# Patient Record
Sex: Male | Born: 1960 | Race: Asian | Hispanic: No | Marital: Married | Smoking: Former smoker
Health system: Southern US, Community
[De-identification: ages and names within clinical notes are randomized; demographics above are authoritative.]

## PROBLEM LIST (undated history)

## (undated) DIAGNOSIS — C801 Malignant (primary) neoplasm, unspecified: Secondary | ICD-10-CM

## (undated) DIAGNOSIS — G44209 Tension-type headache, unspecified, not intractable: Secondary | ICD-10-CM

## (undated) DIAGNOSIS — J309 Allergic rhinitis, unspecified: Secondary | ICD-10-CM

## (undated) DIAGNOSIS — K449 Diaphragmatic hernia without obstruction or gangrene: Secondary | ICD-10-CM

## (undated) DIAGNOSIS — K649 Unspecified hemorrhoids: Secondary | ICD-10-CM

## (undated) DIAGNOSIS — Z85850 Personal history of malignant neoplasm of thyroid: Secondary | ICD-10-CM

## (undated) DIAGNOSIS — D1803 Hemangioma of intra-abdominal structures: Secondary | ICD-10-CM

## (undated) DIAGNOSIS — E785 Hyperlipidemia, unspecified: Secondary | ICD-10-CM

## (undated) DIAGNOSIS — E039 Hypothyroidism, unspecified: Secondary | ICD-10-CM

## (undated) DIAGNOSIS — R079 Chest pain, unspecified: Secondary | ICD-10-CM

## (undated) DIAGNOSIS — K219 Gastro-esophageal reflux disease without esophagitis: Secondary | ICD-10-CM

## (undated) DIAGNOSIS — R040 Epistaxis: Secondary | ICD-10-CM

## (undated) HISTORY — DX: Allergic rhinitis, unspecified: J30.9

## (undated) HISTORY — PX: THYROIDECTOMY: SHX17

## (undated) HISTORY — DX: Hyperlipidemia, unspecified: E78.5

## (undated) HISTORY — DX: Epistaxis: R04.0

## (undated) HISTORY — DX: Chest pain, unspecified: R07.9

## (undated) HISTORY — DX: Diaphragmatic hernia without obstruction or gangrene: K44.9

## (undated) HISTORY — DX: Tension-type headache, unspecified, not intractable: G44.209

## (undated) HISTORY — DX: Unspecified hemorrhoids: K64.9

## (undated) HISTORY — DX: Malignant (primary) neoplasm, unspecified: C80.1

## (undated) HISTORY — PX: CHOLECYSTECTOMY: SHX55

## (undated) HISTORY — DX: Hemangioma of intra-abdominal structures: D18.03

---

## 2002-07-19 ENCOUNTER — Encounter: Admission: RE | Admit: 2002-07-19 | Discharge: 2002-07-19 | Payer: Self-pay | Admitting: Internal Medicine

## 2002-07-19 ENCOUNTER — Encounter: Payer: Self-pay | Admitting: Internal Medicine

## 2002-09-06 ENCOUNTER — Encounter: Admission: RE | Admit: 2002-09-06 | Discharge: 2002-09-06 | Payer: Self-pay | Admitting: Gastroenterology

## 2002-09-06 ENCOUNTER — Encounter: Payer: Self-pay | Admitting: Gastroenterology

## 2002-09-16 ENCOUNTER — Ambulatory Visit (HOSPITAL_COMMUNITY): Admission: RE | Admit: 2002-09-16 | Discharge: 2002-09-16 | Payer: Self-pay | Admitting: Gastroenterology

## 2003-07-25 ENCOUNTER — Encounter: Admission: RE | Admit: 2003-07-25 | Discharge: 2003-07-25 | Payer: Self-pay | Admitting: Internal Medicine

## 2004-08-23 ENCOUNTER — Encounter: Admission: RE | Admit: 2004-08-23 | Discharge: 2004-08-23 | Payer: Self-pay | Admitting: Family Medicine

## 2005-04-07 ENCOUNTER — Encounter: Admission: RE | Admit: 2005-04-07 | Discharge: 2005-04-07 | Payer: Self-pay | Admitting: Internal Medicine

## 2007-03-01 ENCOUNTER — Encounter: Admission: RE | Admit: 2007-03-01 | Discharge: 2007-03-01 | Payer: Self-pay | Admitting: Internal Medicine

## 2007-06-15 ENCOUNTER — Encounter: Admission: RE | Admit: 2007-06-15 | Discharge: 2007-06-15 | Payer: Self-pay | Admitting: Occupational Medicine

## 2008-08-22 ENCOUNTER — Ambulatory Visit (HOSPITAL_COMMUNITY): Admission: RE | Admit: 2008-08-22 | Discharge: 2008-08-22 | Payer: Self-pay | Admitting: Gastroenterology

## 2008-10-06 ENCOUNTER — Encounter (INDEPENDENT_AMBULATORY_CARE_PROVIDER_SITE_OTHER): Payer: Self-pay | Admitting: General Surgery

## 2008-10-06 ENCOUNTER — Ambulatory Visit (HOSPITAL_COMMUNITY): Admission: RE | Admit: 2008-10-06 | Discharge: 2008-10-07 | Payer: Self-pay | Admitting: General Surgery

## 2010-07-17 ENCOUNTER — Encounter: Payer: Self-pay | Admitting: Internal Medicine

## 2010-10-06 LAB — CBC
RDW: 12.5 % (ref 11.5–15.5)
WBC: 6.6 10*3/uL (ref 4.0–10.5)

## 2010-11-09 NOTE — Discharge Summary (Signed)
NAMEBANDY, HONAKER                ACCOUNT NO.:  [id number]   MEDICAL RECORD NO.:  [id number]          PATIENT TYPE:  OIB   LOCATION:  5121                         FACILITY:  [facility]   PHYSICIAN:  Gabrielle Dare. Janee Morn, M.D.DATE OF BIRTH:  Nov 19, 1960   DATE OF ADMISSION:  10/06/2008  DATE OF DISCHARGE:  10/07/2008                               DISCHARGE SUMMARY   DISCHARGE DIAGNOSES:  1. Biliary dyskinesia.  2. Status post laparoscopic cholecystectomy with intraoperative      cholangiogram.   HISTORY OF PRESENT ILLNESS:  Mr. Laviolette is a 50 year old Filipino  gentleman who presented for elective cholecystectomy.   HOSPITAL COURSE:  The patient underwent an uncomplicated laparoscopic  cholecystectomy with intraoperative cholangiogram.  He had findings  consistent with chronic cholecystitis on operation.  Postoperatively, he  had a little nausea and vomiting.  He was kept overnight.  This resolved  and he tolerated p.o. and he was discharged on postoperative day #1 in  stable condition.   DISCHARGE DIET:  Low fat.   DISCHARGE ACTIVITY:  No lifting.   DISCHARGE MEDICATIONS:  Per the patient's request, Ultram 50 mg 1 p.o.  q.4 h. p.r.n. pain and follow up is in 3 weeks with myself.      Gabrielle Dare Janee Morn, M.D.  Electronically Signed     BET/MEDQ  D:  10/07/2008  T:  10/07/2008  Job:  [id number]

## 2010-11-09 NOTE — Op Note (Signed)
NAMECORTAVIUS, MONTESINOS                ACCOUNT NO.:  [id number]   MEDICAL RECORD NO.:  [id number]          PATIENT TYPE:  OIB   LOCATION:  5121                         FACILITY:  [facility]   PHYSICIAN:  Gabrielle Dare. Janee Morn, M.D.DATE OF BIRTH:  12-08-1960   DATE OF PROCEDURE:  10/06/2008  DATE OF DISCHARGE:                               OPERATIVE REPORT   PREOPERATIVE DIAGNOSIS:  Biliary dyskinesia.   POSTOPERATIVE DIAGNOSIS:  Biliary dyskinesia.   PROCEDURE:  Laparoscopic cholecystectomy with intraoperative  cholangiogram.   SURGEON:  Gabrielle Dare. Janee Morn, MD   ANESTHESIA:  General endotracheal.   HISTORY OF PRESENT ILLNESS:  Mr. Campos is a 50 year old Filipino  gentleman who has been having classic gallbladder symptoms with episodic  right upper quadrant pain following fatty meals.  Despite an  unremarkable abdominal ultrasound and decent gallbladder ejection  fraction on HIDA, his symptoms are very persistent and classic for  gallbladder issues, this is most likely biliary dyskinesia and he  presents for elective cholecystectomy.   PROCEDURE IN DETAIL:  Informed consent was obtained, after identifying  the patient in the preop holding area.  He received intravenous  antibiotics.  He was brought to the operating room and general  anesthesia was administered by Anesthesia.  His abdomen was prepped and  draped in sterile fashion.  A time out procedure was done.  Infraumbilical region was infiltrated with 0.25% Marcaine and  infraumbilical incision was made.  Subcutaneous tissues were dissected  down revealing the anterior fascia.  This was divided sharply along the  midline.  The  peritoneal cavity was then entered under direct vision  without difficulty.  A 0-Vicryl pursestring suture was placed around the  fascial opening and a Hasson trocar was inserted into the abdomen.  The  abdomen was insufflated with carbon dioxide in a standard fashion.  Under direct vision, a 11-mm epigastric and  two 5-mm lateral ports were  placed.  A 0.25% Marcaine with epinephrine was used at all port sites.  The dome of the gallbladder was retracted superomedially.  This revealed  a lot of filmy omental adhesions down to the body and infundibulum of  the gallbladder.  These were gently swept away nicely revealing the  infundibulum.  The infundibulum was then retracted inferolaterally.  His  anatomy was well defined with a CT scan.  Cystic duct going down to the  junction with the common bile duct, dissection began laterally and  progressed medially, easily identifying the cystic duct and cystic  artery.  Dissection continued until a large window was made between the  cystic duct, the infundibulum, and the liver.  The cystic artery was  also dissected out at this time and 2 clips were placed proximally on  it.  Once we had excellent visualization, a clip was placed on the  infundibular cystic duct junction and a small nick was made in the  cystic duct and retic cholangiogram catheter was inserted.  The  intraoperative cholangiogram was obtained demonstrating good length of  cystic duct.  No common bile duct filling defect and good flow of  contrast into the  duodenum.  This completed the cholangiogram.  The  cholangiogram catheter was removed.  The 3 clips were placed proximally  and the cystic duct then was divided.  The cystic artery was then  clipped proximally and divided as well.  The gallbladder was taken off  the liver bed with Bovie cautery.  We did encounter some small posterior  branches of the cystic artery and other veins.  These were clipped along  the way.  The gallbladder was removed directly from the liver bed and  placed in an EndoCatch bag.  It was removed from the abdomen via the  infraumbilical port site.  The liver bed was then copiously irrigated.  Meticulous hemostasis was ensured.  Clips remained in excellent  position.  The irrigation fluid was evacuated and returned  clear.  The  liver bed was rechecked, it remained dry with clips in good position.  The ports were then removed under direct vision.  Pneumoperitoneum was  released.  The infraumbilical fascia was closed by tiny 0-Vicryl  pursestring suture with care not to trap any intraabdominal contents.  All 4 wounds were copiously irrigated and the skin of each was closed  with a running 4-0 Vicryl subcuticular stitch followed by Dermabond.  The sponge, needle, and instrument counts were correct.  The patient  tolerated the procedure well without apparent complications.  The  patient was taken to the recovery room in stable condition.      Gabrielle Dare Janee Morn, M.D.  Electronically Signed     BET/MEDQ  D:  10/06/2008  T:  10/07/2008  Job:  [id number]   cc:   Anselmo Rod, M.D.

## 2010-11-12 NOTE — Op Note (Signed)
   NAME:  Ryan Knapp, Ryan Knapp                          ACCOUNT NO.:  [id number]   MEDICAL RECORD NO.:  [id number]                   PATIENT TYPE:  AMB   LOCATION:  ENDO                                 FACILITY:   PHYSICIAN:  Anselmo Rod, M.D.               DATE OF BIRTH:  [birth year]   DATE OF PROCEDURE:  09/16/2002  DATE OF DISCHARGE:                                 OPERATIVE REPORT   PROCEDURE:  Colonoscopy.   ENDOSCOPIST:  Charna Elizabeth, M.D.   INSTRUMENT USED:  Olympus video colonoscope.   INDICATIONS FOR PROCEDURE:  Change in bowel habits with more loose stools  and thickening of the rectal wall on recent CT scan.  Rule out rectal mass,  villous adenoma, polyps, etc.   PREPROCEDURE PREPARATION:  Informed consent was procured from the patient.  The patient was fasted for eight hours prior to the procedure and prepped  with a bottle of magnesium citrate and a gallon of GoLYTELY the night prior  to the procedure.   PREPROCEDURE PHYSICAL:  VITAL SIGNS:  The patient had stable vital signs.  NECK:  Supple.  CHEST:  Clear to auscultation.  CARDIAC:  S1 and S2 regular.  ABDOMEN:  Soft with normal bowel sounds.   DESCRIPTION OF PROCEDURE:  The patient was placed in the left lateral  decubitus position and sedated with 50 mg of Demerol and 5 mg of Versed  intravenously.  Once the patient was adequately sedated, maintained on low  flow oxygen and continuous cardiac monitoring, the Olympus video colonoscope  was advanced from the rectum to the cecum with difficulty.  There was large  amount of residual stool in the right colon.  Multiple washings were done  but portions of the mucosa were not clearly visualized.  Small lesions could  have been missed.  Small internal hemorrhoids were noted.  On retroflexion  examination, there were no abnormalities.   IMPRESSION:  1. Essentially unrevealing colonoscopy.  2. Small nonbleeding internal hemorrhoids.  3. Large amount of residual stool  in the right colon.  Small lesions could     have been missed.   RECOMMENDATIONS:  Outpatient followup advised in the next two weeks, earlier  if symptoms do not improve on a high fiber.  Further workup will be  undertaken.                                                Anselmo Rod, M.D.    JNM/MEDQ  D:  09/17/2002  T:  09/18/2002  Job:  [id number]

## 2011-03-17 ENCOUNTER — Encounter: Payer: Self-pay | Admitting: Family Medicine

## 2011-03-17 ENCOUNTER — Inpatient Hospital Stay (INDEPENDENT_AMBULATORY_CARE_PROVIDER_SITE_OTHER)
Admission: RE | Admit: 2011-03-17 | Discharge: 2011-03-17 | Disposition: A | Discharge status: Home / Self Care | Payer: PRIVATE HEALTH INSURANCE | Source: Ambulatory Visit | Attending: Family Medicine | Admitting: Family Medicine

## 2011-03-17 DIAGNOSIS — R0789 Other chest pain: Secondary | ICD-10-CM

## 2011-05-30 NOTE — Progress Notes (Signed)
Summary: CHEST PAIN WHEN PT BENDS HIS NECK DWN,HEADACHE...WSE (rm 5)   Vital Signs:  Patient Profile:   50 Years Old Male CC:      CP with head movement x this AM Height:     69.5 inches Weight:      163 pounds O2 Sat:      98 % O2 treatment:    Room Air Temp:     98.5 degrees F oral Pulse rate:   72 / minute Resp:     16 per minute BP sitting:   136 / 77  (left arm) Cuff size:   regular  Vitals Entered By: Lajean Saver RN (March 17, 2011 1:30 PM)                  Updated Prior Medication List: MULTIVITAMINS  TABS (MULTIPLE VITAMIN)  FISH OIL 1000 MG CAPS (OMEGA-3 FATTY ACIDS)  * CALCIUM  SYNTHROID 150 MCG TABS (LEVOTHYROXINE SODIUM)   Current Allergies: No known allergies History of Present Illness Chief Complaint: CP with head movement x this AM History of Present Illness:  Subjective:  Patient complains of awakening with tightness in his anterior chest this morning, most noticeable when he flexes his neck towards his chest.  He also has soreness in his chest with chest movement.  No shortness of breath.  He states that he works out at gym regularly using a variety of weight machines that target his chest muscles.  He also performs push-ups as part of his routine.  He states that he exercised last night, after having not worked out for about a week.  No cough or shortness of breath. He reports that he had a negative cardiac stress test about 3 to 4 years ago.  REVIEW OF SYSTEMS Constitutional Symptoms      Denies fever, chills, night sweats, weight loss, weight gain, and fatigue.  Eyes       Denies change in vision, eye pain, eye discharge, glasses, contact lenses, and eye surgery. Ear/Nose/Throat/Mouth       Denies hearing loss/aids, change in hearing, ear pain, ear discharge, dizziness, frequent runny nose, frequent nose bleeds, sinus problems, sore throat, hoarseness, and tooth pain or bleeding.  Respiratory       Complains of shortness of breath.      Denies  dry cough, productive cough, wheezing, asthma, bronchitis, and emphysema/COPD.      Comments: minimal SOB Cardiovascular       Complains of chest pain.      Denies murmurs and tires easily with exhertion.    Gastrointestinal       Denies stomach pain, nausea/vomiting, diarrhea, constipation, blood in bowel movements, and indigestion. Genitourniary       Denies painful urination, blood or discharge from penis, kidney stones, and loss of urinary control. Neurological       Complains of headaches.      Denies paralysis, seizures, and fainting/blackouts.      Comments:  frequent sinus HAs Musculoskeletal       Denies muscle pain, joint pain, joint stiffness, decreased range of motion, redness, swelling, muscle weakness, and gout.  Skin       Denies bruising, unusual mles/lumps or sores, and hair/skin or nail changes.  Psych       Denies mood changes, temper/anger issues, anxiety/stress, speech problems, depression, and sleep problems. Other Comments: Patient c/o CP this AM with movement. Now c/o CP when looking down. He c/o minimal SOB. He did push-ups last night. Last  stress test 3 years ago WNL. Pain is in center of chest and described as a "stretching" feeling. 4/10 pain relieved from this AM   Past History:  Past Medical History: sinusitis- chronic Intermittent CP- stress tests normal  Past Surgical History: Cholecystectomy 2002 Thyroidectomy 2010  Family History: Mother- CVA Sister- CVA  Social History: Married Never Smoked Alcohol use-no Drug use-no Regular exercise-yes Smoking Status:  never Drug Use:  no Does Patient Exercise:  yes   Objective:  Appearance:  Patient appears healthy, stated age, and in no acute distress  Eyes:  Pupils are equal, round, and reactive to light and accomodation.  Extraocular movement is intact.  Conjunctivae are not inflamed.  Mouth/pharynx:  Normal Neck:  Supple.  No adenopathy is present.  No thyromegaly is present.  Good range of  motion.  Discomfort is elicited when he flexes his neck. Chest:  Nontender Heart:  Regular rate and rhythm without murmurs, rubs, or gallops.  Abdomen:  Nontender without masses or hepatosplenomegaly.  Bowel sounds are present.  No CVA or flank tenderness.                                                     Lungs:  Clear to auscultation.  Breath sounds are equal.    Abdomen:  Nontender without masses or hepatosplenomegaly.  Bowel sounds are present.  No CVA or flank tenderness.  EKG:  No acute changes                  Assessment New Problems: CHEST PAIN, NON-CARDIAC (ICD-786.59)  MUSCULOSKELETAL CH  Plan New Orders: EKG w/  Interpretation [93000] New Patient Level IV [78295] Planning Comments:   Apply ice pack several times daily.  Ibuprofen. Rest for two days then resume exercises gently.  Stretch before working out.  Return for worsening symptoms  The patient and/or caregiver has been counseled thoroughly with regard to medications prescribed including dosage, schedule, interactions, rationale for use, and possible side effects and they verbalize understanding.  Diagnoses and expected course of recovery discussed and will return if not improved as expected or if the condition worsens. Patient and/or caregiver verbalized understanding.   Orders Added: 1)  EKG w/ Interpretation [93000] 2)  New Patient Level IV [16109]

## 2011-05-30 NOTE — Letter (Signed)
Summary: Out of Work  [facility]  [address]   [place], [place] [address]   Phone: [phone]  Fax: [phone]    March 17, 2011   Employee:  [employer]    To Whom It May Concern:   For Medical reasons, please excuse the above named employee from work today.   If you need additional information, please feel free to contact our office.         Sincerely,    Donna Christen MD

## 2011-06-07 ENCOUNTER — Emergency Department (INDEPENDENT_AMBULATORY_CARE_PROVIDER_SITE_OTHER)
Admission: EM | Admit: 2011-06-07 | Discharge: 2011-06-07 | Disposition: A | Discharge status: Home / Self Care | Payer: PRIVATE HEALTH INSURANCE | Source: Home / Self Care | Attending: Emergency Medicine | Admitting: Emergency Medicine

## 2011-06-07 ENCOUNTER — Encounter: Payer: Self-pay | Admitting: *Deleted

## 2011-06-07 ENCOUNTER — Emergency Department
Admit: 2011-06-07 | Discharge: 2011-06-07 | Disposition: A | Discharge status: Home / Self Care | Payer: PRIVATE HEALTH INSURANCE

## 2011-06-07 DIAGNOSIS — R0602 Shortness of breath: Secondary | ICD-10-CM

## 2011-06-07 DIAGNOSIS — R079 Chest pain, unspecified: Secondary | ICD-10-CM

## 2011-06-07 HISTORY — DX: Personal history of malignant neoplasm of thyroid: Z85.850

## 2011-06-07 NOTE — ED Provider Notes (Signed)
History     CSN: [id number] Arrival date & time: 06/07/2011  1:37 PM   First MD Initiated Contact with Patient 06/07/11 1354      Chief Complaint  Patient presents with  . Chest Pain    (Consider location/radiation/quality/duration/timing/severity/associated sxs/prior treatment) HPI This patient comes in today complaining of chest pain. It has been going on for the last 2 days rarely constantly. 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 He states that he has had pain like this in the past, the last time 2 months ago. It seemed to get better on its own. When he lived in [place] a few years ago, he did have multiple stress tests done for this which were all normal. Since his move to [place] he has not seen a cardiologist. He does not believe this is cardiac related but thinks it is more stress-induced. He has also been having some short of breath. The chest pain is located centrally and does not radiate to his arm or his neck. He has not been having any upper respiratory symptoms such as a runny nose or a cough. He has not been taking any medicine for this. He does have high blood pressure and reports that recently his blood pressure has been a little bit higher than normal. He states that he has been under increased stress however he has not discussed this with his primary care Dr. 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 He states that this pain was worse yesterday, but he is feeling better today but his wife convinced him to come here to the urgent care facility. No numbness, tingling in his extremities, no weakness, confusion or visual problems. He is not a smoker. His father passed away from emphysema and his mother had a stroke but there is no other cardiovascular problems in the family.   Past Medical History  Diagnosis Date  . Hx of thyroid cancer       Past Surgical History  Procedure Date  . Thyroidectomy   . Cholecystectomy     Family History  Problem Relation Age of Onset  . Stroke Mother     History  Substance Use Topics  . Smoking status: Never Smoker   . Smokeless tobacco: Not on file  . Alcohol Use: No      Review of Systems  Allergies  Review of patient's allergies indicates no known allergies.  Home Medications   Current Outpatient Rx  Name Route Sig Dispense Refill  . LEVOTHYROXINE SODIUM 150 MCG PO TABS Oral Take 150 mcg by mouth daily.        BP 161/91  Pulse 68  Temp(Src) 98.2 F (36.8 C) (Oral)  Resp 18  Wt 167 lb 8 oz (75.978 kg)  SpO2 99%  Physical Exam  Nursing note and vitals reviewed. Constitutional: He is oriented to person, place, and time. He appears well-developed and well-nourished.  Non-toxic appearance. He does not have a sickly appearance. He does not appear ill. No distress.  HENT:  Head: Normocephalic and atraumatic.  Right Ear: External ear and ear canal normal.  Left Ear: Tympanic membrane and external ear normal.  Nose: Nose normal.  Mouth/Throat: Uvula is midline, oropharynx is clear and moist and mucous membranes are normal.  Eyes: No scleral icterus.  Neck: Neck supple.  Cardiovascular: Normal rate, regular rhythm, normal heart sounds  and normal pulses.   No murmur heard. Pulmonary/Chest: Effort normal and breath sounds normal. No respiratory distress. He has no decreased breath sounds. He has no wheezes. He has no rhonchi.  Neurological: He is alert and oriented to person, place, and time.  Skin: Skin is warm and dry. He is not diaphoretic.  Psychiatric: He has a normal mood and affect. His speech is normal and behavior is normal. Thought content normal.    ED Course  Procedures (including critical care time)  Labs Reviewed - No data to display Dg Chest 2 View  06/07/2011  *RADIOLOGY REPORT*  Clinical Data: Central chest pain.  Shortness of breath.  CHEST - 2  VIEW  Comparison: 10 12/06  Findings: Heart size is normal.  There are no focal consolidations or pleural effusions.  No evidence for pulmonary edema. Visualized osseous structures have a normal appearance.  Surgical clips are identified in the upper abdomen.  IMPRESSION: Negative exam.  Original Report Authenticated By: Patterson Hammersmith, M.D.     1. Chest pain   2. Shortness of breath       MDM  I discussed the patient's condition with him. We did obtain an EKG which is read as borderline.  I think the best thing for him today would be to be seen in the emergency room since I cannot do stat labs here. However he does not want to go. I feel that he has the mental capacity to refuse transfer. We also obtained a chest x-ray which the results are above. All his vital signs appear to be normal except for a high blood pressure. Since this is a recurrent problem and does not appear to be acute, I will be sending him to a cardiologist for further evaluation just to make sure that this is not cardiac related. Differential diagnosis includes anxiety which the patient thinks is the most likely scenario, GERD, or chest wall strain. I would also like him to followup with his primary care doctor to discuss his symptoms. I have given him the ER precautions in case his chest pain gets worse, increased shortness of breath, or new severe symptoms. The patient does understand and agree to do this.  Lily Kocher, MD 06/07/11 1430

## 2011-06-07 NOTE — ED Notes (Signed)
Appt sch'ed with [facility] cardiology 06/08/11 @ 8:30AM with Dr. Lorenso Courier. Pt notified, notes faxed.

## 2011-06-07 NOTE — ED Notes (Signed)
Pt c/o middle of chest pain and SOB x 1 day. Denies N, arm pain, cough.

## 2011-06-14 ENCOUNTER — Other Ambulatory Visit: Payer: Self-pay | Admitting: Internal Medicine

## 2011-06-16 ENCOUNTER — Ambulatory Visit
Admission: RE | Admit: 2011-06-16 | Discharge: 2011-06-16 | Disposition: A | Discharge status: Home / Self Care | Payer: PRIVATE HEALTH INSURANCE | Source: Ambulatory Visit | Attending: Internal Medicine | Admitting: Internal Medicine

## 2011-06-16 MED ORDER — IOHEXOL 300 MG/ML  SOLN
75.0000 mL | Freq: Once | INTRAMUSCULAR | Status: AC | PRN
  Administered 2011-06-16: 75 mL via INTRAVENOUS

## 2012-01-31 ENCOUNTER — Ambulatory Visit
Admission: RE | Admit: 2012-01-31 | Discharge: 2012-01-31 | Disposition: A | Discharge status: Home / Self Care | Payer: BC Managed Care – PPO | Source: Ambulatory Visit | Attending: Internal Medicine | Admitting: Internal Medicine

## 2012-01-31 ENCOUNTER — Other Ambulatory Visit: Payer: Self-pay | Admitting: Internal Medicine

## 2012-01-31 DIAGNOSIS — R51 Headache: Secondary | ICD-10-CM

## 2013-02-05 ENCOUNTER — Ambulatory Visit: Payer: Self-pay | Admitting: Family Medicine

## 2013-02-05 VITALS — BP 125/83 | HR 73 | Temp 98.7°F | Resp 18 | Ht 69.0 in | Wt 170.0 lb

## 2013-02-05 DIAGNOSIS — F411 Generalized anxiety disorder: Secondary | ICD-10-CM

## 2013-02-05 DIAGNOSIS — R202 Paresthesia of skin: Secondary | ICD-10-CM

## 2013-02-05 DIAGNOSIS — R209 Unspecified disturbances of skin sensation: Secondary | ICD-10-CM

## 2013-02-05 DIAGNOSIS — R109 Unspecified abdominal pain: Secondary | ICD-10-CM

## 2013-02-05 DIAGNOSIS — F419 Anxiety disorder, unspecified: Secondary | ICD-10-CM

## 2013-02-05 DIAGNOSIS — G4700 Insomnia, unspecified: Secondary | ICD-10-CM

## 2013-02-05 LAB — POCT URINALYSIS DIPSTICK
Bilirubin, UA: NEGATIVE
Blood, UA: NEGATIVE
Glucose, UA: NEGATIVE
Ketones, UA: NEGATIVE
Leukocytes, UA: NEGATIVE
Nitrite, UA: NEGATIVE
Protein, UA: NEGATIVE
Spec Grav, UA: 1.02
Urobilinogen, UA: 0.2
pH, UA: 6

## 2013-02-05 LAB — POCT CBC
Granulocyte percent: 56.6 % (ref 37–80)
HCT, POC: 45.1 % (ref 43.5–53.7)
Hemoglobin: 14.6 g/dL (ref 14.1–18.1)
Lymph, poc: 2.4 (ref 0.6–3.4)
MCH, POC: 30.2 pg (ref 27–31.2)
MCHC: 32.4 g/dL (ref 31.8–35.4)
MCV: 93.4 fL (ref 80–97)
MID (cbc): 0.5 (ref 0–0.9)
MPV: 7.2 fL (ref 0–99.8)
POC Granulocyte: 3.8 (ref 2–6.9)
POC LYMPH PERCENT: 36.4 % (ref 10–50)
POC MID %: 7 % (ref 0–12)
Platelet Count, POC: 260 K/uL (ref 142–424)
RBC: 4.83 M/uL (ref 4.69–6.13)
RDW, POC: 13 %
WBC: 6.7 K/uL (ref 4.6–10.2)

## 2013-02-05 MED ORDER — ALPRAZOLAM 0.5 MG PO TABS: 0.2500 mg | ORAL_TABLET | Freq: Two times a day (BID) | ORAL | Status: DC | PRN

## 2013-02-05 NOTE — Progress Notes (Signed)
[address]   [place], [place]  [address]   [phone]  Subjective:    Patient ID: Ryan Knapp, male    DOB: 12-31-1960, 52 y.o.   MRN: [id number]  HPI This 52 y.o. male presents for evaluation of tingling all over the body.  Onset last night.  +HA last night also; took Tylenol today with resolution of headache.  +dizziness with headache, paresthesias.  Blurred vision intermittently today.  No focal weakness; no dysphagia; no slurred speech.  Tingling is from head to toes and all fingers.  No chest pain, palpitations, leg swelling.  No nausea, diaphoresis.  Similar symptoms in 07/2012; presented to [facility] ED; underwent stress testing negative at that time; diagnosed with stress.  Admits to stress; chronic worrier; worries about children a lot.  Gets anxious when physically feels weird; has difficult time taking a deep breath at times.  Exercising daily with no SOB, DOE; no chest pain.  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   No previous treatment for stress/worry/anxiety.  2. RUQ pain: onset last night; no fever/chills/sweats.  No n/v/d; +constipation; took medication two days in a row with normal bowel movement today with decrease in RUQ pain.  History of large 5cm liver lesion; s/p liver biopsy twelve years ago; negative biopsy.  S/p extensive work up at [facility] in 07/2012; persistent liver issue. PCP advised that it is safe for patient to drink one glass of red wine per night for high cholesterol; now worried about liver with current pain.  S/p cholecystectomy.  Review of Systems  Constitutional: Negative for fever, chills, diaphoresis and fatigue.  HENT: Positive for congestion, rhinorrhea and sneezing.   Respiratory: Positive for shortness of breath. Negative for cough, choking, chest tightness, wheezing and stridor.   Cardiovascular: Negative for chest pain, palpitations and leg swelling.  Gastrointestinal: Positive for abdominal pain and  constipation. Negative for nausea, vomiting, diarrhea, blood in stool, abdominal distention, anal bleeding and rectal pain.  Skin: Negative for color change, pallor, rash and wound.  Neurological: Positive for dizziness and headaches. Negative for tremors, seizures, syncope, facial asymmetry, speech difficulty, weakness, light-headedness and numbness.  Psychiatric/Behavioral: Positive for sleep disturbance. Negative for suicidal ideas and self-injury. The patient is nervous/anxious.     Past Medical History  Diagnosis Date  . Hx of thyroid cancer   . Cancer     Past Surgical History  Procedure Laterality Date  . Thyroidectomy    . Cholecystectomy      Prior to Admission medications   Medication Sig Start Date End Date Taking? Authorizing Provider  levothyroxine (SYNTHROID, LEVOTHROID) 150 MCG tablet Take 150 mcg by mouth daily.     Yes Historical Provider, MD  verapamil (CALAN) 120 MG tablet Take 120 mg by mouth 3 (three) times daily.   Yes Historical Provider, MD  ALPRAZolam Prudy Feeler) 0.5 MG tablet Take 0.5-1 tablets (0.25-0.5 mg total) by mouth 2 (two) times daily as needed for sleep or anxiety. 02/05/13   Ethelda Chick, MD    No Known Allergies  History   Social History  . Marital Status: Married    Spouse Name: N/A    Number of Children: N/A  . Years of Education: N/A   Occupational History  . Not on file.   Social History Main Topics  . Smoking status: Never Smoker   . Smokeless tobacco: Not on file  . Alcohol Use: No  . Drug Use: No  .  Sexually Active: Yes   Other Topics Concern  . Not on file   Social History Narrative  . No narrative on file    Family History  Problem Relation Age of Onset  . Stroke Mother        Objective:   Physical Exam  Nursing note and vitals reviewed. Constitutional: He is oriented to person, place, and time. He appears well-developed and well-nourished. No distress.  HENT:  Head: Normocephalic and atraumatic.  Right Ear:  External ear normal.  Left Ear: External ear normal.  Nose: Nose normal.  Mouth/Throat: Oropharynx is clear and moist.  Eyes: Conjunctivae and EOM are normal. Pupils are equal, round, and reactive to light.  Neck: Normal range of motion. Neck supple. No thyromegaly present.  Cardiovascular: Normal rate, regular rhythm, normal heart sounds and intact distal pulses.  Exam reveals no gallop and no friction rub.   No murmur heard. Pulmonary/Chest: Effort normal and breath sounds normal. He has no wheezes. He has no rales.  Abdominal: Soft. Bowel sounds are normal. He exhibits no distension and no mass. There is no tenderness. There is no rebound and no guarding.  Musculoskeletal:       Right shoulder: Normal.       Left shoulder: Normal.       Cervical back: Normal.       Thoracic back: Normal.       Lumbar back: Normal.  Lymphadenopathy:    He has no cervical adenopathy.  Neurological: He is alert and oriented to person, place, and time. He has normal reflexes. No cranial nerve deficit. He exhibits normal muscle tone. Coordination normal.  Skin: Skin is warm and dry. No rash noted. He is not diaphoretic. No erythema. No pallor.  Psychiatric: He has a normal mood and affect. His behavior is normal. Judgment and thought content normal.      Results for orders placed in visit on 02/05/13  POCT CBC      Result Value Range   WBC 6.7  4.6 - 10.2 K/uL   Lymph, poc 2.4  0.6 - 3.4   POC LYMPH PERCENT 36.4  10 - 50 %L   MID (cbc) 0.5  0 - 0.9   POC MID % 7.0  0 - 12 %M   POC Granulocyte 3.8  2 - 6.9   Granulocyte percent 56.6  37 - 80 %G   RBC 4.83  4.69 - 6.13 M/uL   Hemoglobin 14.6  14.1 - 18.1 g/dL   HCT, POC 16.1  09.6 - 53.7 %   MCV 93.4  80 - 97 fL   MCH, POC 30.2  27 - 31.2 pg   MCHC 32.4  31.8 - 35.4 g/dL   RDW, POC 04.5     Platelet Count, POC 260  142 - 424 K/uL   MPV 7.2  0 - 99.8 fL  POCT URINALYSIS DIPSTICK      Result Value Range   Color, UA yellow     Clarity, UA clear      Glucose, UA neg     Bilirubin, UA neg     Ketones, UA neg     Spec Grav, UA 1.020     Blood, UA neg     pH, UA 6.0     Protein, UA neg     Urobilinogen, UA 0.2     Nitrite, UA neg     Leukocytes, UA Negative      Assessment & Plan:  Abdominal pain -  Plan: POCT CBC, Comprehensive metabolic panel, TSH, Vitamin B12, EKG 12-Lead, POCT urinalysis dipstick, CANCELED: POCT UA - Microscopic Only  Paresthesias  Insomnia - Plan: ALPRAZolam (XANAX) 0.5 MG tablet  Anxiety - Plan: ALPRAZolam (XANAX) 0.5 MG tablet   1. Paresthesias:  New.  Diffuse paresthesias with no focal neurological exam.  Obtain labs including Vitamin B12, TSH, glucose; reassurance provided. 2.  Abdominal pain RUQ:  New.  No associated symptoms; obtain u/a, CMET; history of liver lesion s/p biopsy that was negative; recent alcohol ingestion. Benign abdominal exam. RTC for acute worsening or development of vomiting, diarrhea.  Recent constipation may be etiology to RUQ pain.  3.  Insomnia:  New.  rx for Alprazolam provided to use PRN. 4.  Anxiety: chronic issue with recent worsening; rx for Xanax provided to use sparingly and as needed. Recommend follow-up with PCP in upcoming 1-3 months.    Meds ordered this encounter  Medications  . verapamil (CALAN) 120 MG tablet    Sig: Take 120 mg by mouth 3 (three) times daily.  Marland Kitchen ALPRAZolam (XANAX) 0.5 MG tablet    Sig: Take 0.5-1 tablets (0.25-0.5 mg total) by mouth 2 (two) times daily as needed for sleep or anxiety.    Dispense:  30 tablet    Refill:  0

## 2013-02-06 LAB — COMPREHENSIVE METABOLIC PANEL
ALT: 24 U/L (ref 0–53)
AST: 19 U/L (ref 0–37)
Albumin: 4.7 g/dL (ref 3.5–5.2)
Alkaline Phosphatase: 43 U/L (ref 39–117)
BUN: 13 mg/dL (ref 6–23)
CO2: 28 mEq/L (ref 19–32)
Chloride: 103 mEq/L (ref 96–112)
Creat: 0.88 mg/dL (ref 0.50–1.35)
Glucose, Bld: 86 mg/dL (ref 70–99)
Potassium: 4.1 mEq/L (ref 3.5–5.3)
Sodium: 138 mEq/L (ref 135–145)
Total Bilirubin: 0.4 mg/dL (ref 0.3–1.2)
Total Protein: 7.4 g/dL (ref 6.0–8.3)

## 2013-02-06 LAB — VITAMIN B12: Vitamin B-12: 727 pg/mL (ref 211–911)

## 2013-02-06 LAB — TSH: TSH: 0.956 u[IU]/mL (ref 0.350–4.500)

## 2013-02-12 ENCOUNTER — Encounter: Payer: Self-pay | Admitting: Family Medicine

## 2013-07-24 ENCOUNTER — Encounter: Payer: Self-pay | Admitting: Neurology

## 2013-07-26 ENCOUNTER — Encounter: Payer: Self-pay | Admitting: Neurology

## 2013-07-26 ENCOUNTER — Ambulatory Visit (INDEPENDENT_AMBULATORY_CARE_PROVIDER_SITE_OTHER): Payer: BC Managed Care – PPO | Admitting: Neurology

## 2013-07-26 ENCOUNTER — Encounter (INDEPENDENT_AMBULATORY_CARE_PROVIDER_SITE_OTHER): Payer: Self-pay

## 2013-07-26 VITALS — BP 129/75 | HR 67 | Ht 69.0 in | Wt 166.0 lb

## 2013-07-26 DIAGNOSIS — R209 Unspecified disturbances of skin sensation: Secondary | ICD-10-CM | POA: Insufficient documentation

## 2013-07-26 NOTE — Progress Notes (Signed)
Reason for visit: Numbness  Ryan Knapp is a 53 y.o. male  History of present illness:  Ryan Knapp is a 53 year old right-handed male with a history of numbness and tingling sensations that began spontaneously in July 2014. The patient indicates that he began having numbness and tingling sensations throughout his entire body involving the legs, arms, body, and even the tongue and head. The patient went to urgent medical care, and he was told that the sensory alterations were related to anxiety. The patient was placed on Xanax, and the symptoms improved for several weeks, completely going away. Eventually, the symptoms returned, and they have been persistent since that time. The patient has some undulations of symptoms, but he always has some sensory alteration. The patient has tingling primarily in the legs and arms, and some tingling on the body. The patient denies any weakness of the extremities, or problems with balance. The patient denies issues controlling the bowels or the bladder. The patient has occasional headaches, but this does not correlate with the numbness. The patient denies any visual field changes, neck or back pain, speech or swallowing problems. The patient has been placed on Lyrica, and taking 250 mg Lyrica daily helps the sensations. The patient is sent to this office for further evaluation. The patient has a history of papillary thyroid cancer, and recent thyroglobulin antibody levels have been negative.  Past Medical History  Diagnosis Date  . Hx of thyroid cancer     Radioactive iodine treatment  . Cancer   . Hiatal hernia   . Allergic rhinitis   . Dyslipidemia   . Hepatic hemangioma   . Epistaxis     req cauterization in April 2009  . Hemorrhoids   . Chest pain   . Tension headache     Past Surgical History  Procedure Laterality Date  . Thyroidectomy    . Cholecystectomy      Family History  Problem Relation Age of Onset  . Stroke Mother   . Heart  disease Mother   . COPD Father   . Stroke Sister     Social history:  reports that he quit smoking about 29 years ago. He has never used smokeless tobacco. He reports that he drinks alcohol. He reports that he does not use illicit drugs.  Medications:  Current Outpatient Prescriptions on File Prior to Visit  Medication Sig Dispense Refill  . ALPRAZolam (XANAX) 0.5 MG tablet Take 0.5-1 tablets (0.25-0.5 mg total) by mouth 2 (two) times daily as needed for sleep or anxiety.  30 tablet  0  . levothyroxine (SYNTHROID, LEVOTHROID) 150 MCG tablet Take 150 mcg by mouth daily.        . Omega-3 Fatty Acids (FISH OIL) 1000 MG CAPS Take 1,000 mg by mouth daily.       No current facility-administered medications on file prior to visit.     No Known Allergies  ROS:  Out of a complete 14 system review of symptoms, the patient complains only of the following symptoms, and all other reviewed systems are negative.  Feeling hot Itching Numbness  Blood pressure 129/75, pulse 67, height 5\' 9"  (1.753 m), weight 166 lb (75.297 kg).  Physical Exam  General: The patient is alert and cooperative at the time of the examination.  Eyes: Pupils are equal, round, and reactive to light. Discs are flat bilaterally.  Neck: The neck is supple, no carotid bruits are noted.  Respiratory: The respiratory examination is clear.  Cardiovascular: The  cardiovascular examination reveals a regular rate and rhythm, no obvious murmurs or rubs are noted.  Skin: Extremities are without significant edema.  Neurologic Exam  Mental status: The patient is alert and oriented x 3 at the time of the examination. The patient has apparent normal recent and remote memory, with an apparently normal attention span and concentration ability.  Cranial nerves: Facial symmetry is present. There is good sensation of the face to pinprick and soft touch bilaterally. The strength of the facial muscles and the muscles to head turning and  shoulder shrug are normal bilaterally. Speech is well enunciated, no aphasia or dysarthria is noted. Extraocular movements are full. Visual fields are full. The tongue is midline, and the patient has symmetric elevation of the soft palate. No obvious hearing deficits are noted.  Motor: The motor testing reveals 5 over 5 strength of all 4 extremities. Good symmetric motor tone is noted throughout.  Sensory: Sensory testing is intact to pinprick, soft touch, vibration sensation, and position sense on all 4 extremities. No evidence of extinction is noted.  Coordination: Cerebellar testing reveals good finger-nose-finger and heel-to-shin bilaterally.  Gait and station: Gait is normal. Tandem gait is normal. Romberg is negative. No drift is seen.  Reflexes: Deep tendon reflexes are symmetric and normal bilaterally. Toes are downgoing bilaterally.   Assessment/Plan:  1. Sensory alteration, all 4 extremities and body  The clinical examination is completely normal. The patient had a long duration subjective sensory alteration that initially seemed to improve with alprazolam. The sensory issues certainly could be related to anxiety, but further workup will need to be done to exclude the possibility of demyelinating disease or another metabolic disturbance. The sensory alteration is unlikely to be related to a peripheral neuropathy given the distribution and rapid onset. The patient will undergo MRI evaluation of the brain and cervical spine with and without gadolinium enhancement. Blood work will be done today. The patient will followup if needed.  Jill Alexanders MD 07/26/2013 7:26 PM  [facility] [address] [place], [place] [address]  Phone [phone] Fax [phone]

## 2013-07-27 LAB — HIV ANTIBODY (ROUTINE TESTING W REFLEX): HIV 1/HIV 2 AB: NONREACTIVE

## 2013-07-27 LAB — SEDIMENTATION RATE: SED RATE: 8 mm/h (ref 0–30)

## 2013-07-27 LAB — ANA W/REFLEX: ANA: NEGATIVE

## 2013-07-27 LAB — COPPER, SERUM: Copper: 74 ug/dL (ref 72–166)

## 2013-07-27 LAB — RPR: RPR: NONREACTIVE

## 2013-07-27 LAB — VITAMIN B12: Vitamin B-12: 828 pg/mL (ref 211–946)

## 2013-07-29 ENCOUNTER — Telehealth: Payer: Self-pay | Admitting: Neurology

## 2013-07-29 NOTE — Telephone Encounter (Signed)
Patient states he had no questions about labs but wanted to know if someone would call to schedule his MRI.  Patient was assured that the MRI department would call to schedule that test.

## 2013-07-29 NOTE — Telephone Encounter (Signed)
RETURNING CALL--HAS QUESTIONS ABOUT LAB

## 2013-07-29 NOTE — Telephone Encounter (Signed)
Called patient to inform that the message was concerning labs completed and that they were normal, lt VM message., patient was a little hard to understand and hung up the phone.

## 2013-07-29 NOTE — Telephone Encounter (Signed)
RETURNING CALL-HAD LAB WORK DONE

## 2013-08-08 ENCOUNTER — Ambulatory Visit (INDEPENDENT_AMBULATORY_CARE_PROVIDER_SITE_OTHER): Payer: BC Managed Care – PPO

## 2013-08-08 DIAGNOSIS — R209 Unspecified disturbances of skin sensation: Secondary | ICD-10-CM

## 2013-08-08 MED ORDER — GADOPENTETATE DIMEGLUMINE 469.01 MG/ML IV SOLN: 15.0000 mL | Freq: Once | INTRAVENOUS | Status: AC | PRN

## 2013-08-09 ENCOUNTER — Telehealth: Payer: Self-pay | Admitting: Neurology

## 2013-08-09 NOTE — Telephone Encounter (Signed)
I called patient. The MRI the brain shows a few nonspecific white matter changes. Difficult to know what the clinical significance of this is, not typical for MS. The patient has had MRI of the cervical spine that does not show cord lesions, there is some spondylosis and disc bulges with neuroforaminal stenosis. If the patient wants to be aggressive with workup, lumbar puncture may be the next. He is to contact our office if he wishes to pursue this option.   MRI brain 08/08/2013:  Impression   Mildly abnormal MRI brain (with and without) demonstrating: 1. Few small round foci of non-specific gliosis in the subcortical and  periventricular white matter. These findings are non-specific and  considerations include autoimmune, inflammatory, post-infectious,  microvascular ischemic or migraine associated etiologies.  2. No abnormal enhancing lesions. No acute findings.   MRI cervical spine 08/08/2013:  IMPRESSION:  Abnormal MRI cervical spine (without) demonstrating:  1. At C5-6: disc bulging and uncovertebral joint hypertrophy with mild right and severe left foraminal stenosis.  2. At C4-5: disc bulging and uncovertebral joint hypertrophy with mild biforaminal foraminal stenosis.  3. No intrinsic or abnormal enhancing spinal cord lesions.

## 2013-09-26 ENCOUNTER — Other Ambulatory Visit: Payer: Self-pay | Admitting: Gastroenterology

## 2013-09-26 DIAGNOSIS — R1011 Right upper quadrant pain: Secondary | ICD-10-CM

## 2013-09-26 DIAGNOSIS — R945 Abnormal results of liver function studies: Secondary | ICD-10-CM

## 2013-09-26 DIAGNOSIS — R7989 Other specified abnormal findings of blood chemistry: Secondary | ICD-10-CM

## 2013-10-01 ENCOUNTER — Ambulatory Visit
Admission: RE | Admit: 2013-10-01 | Discharge: 2013-10-01 | Disposition: A | Discharge status: Home / Self Care | Payer: BC Managed Care – PPO | Source: Ambulatory Visit | Attending: Gastroenterology | Admitting: Gastroenterology

## 2013-10-01 ENCOUNTER — Other Ambulatory Visit: Payer: BC Managed Care – PPO

## 2013-10-01 DIAGNOSIS — R7989 Other specified abnormal findings of blood chemistry: Secondary | ICD-10-CM

## 2013-10-01 DIAGNOSIS — R1011 Right upper quadrant pain: Secondary | ICD-10-CM

## 2013-10-01 DIAGNOSIS — R945 Abnormal results of liver function studies: Secondary | ICD-10-CM

## 2016-12-19 ENCOUNTER — Encounter: Payer: Self-pay | Admitting: Vascular Surgery

## 2016-12-20 ENCOUNTER — Encounter: Payer: Self-pay | Admitting: Vascular Surgery

## 2016-12-20 ENCOUNTER — Ambulatory Visit (INDEPENDENT_AMBULATORY_CARE_PROVIDER_SITE_OTHER): Payer: BLUE CROSS/BLUE SHIELD | Admitting: Vascular Surgery

## 2016-12-20 VITALS — BP 134/87 | HR 69 | Temp 98.9°F | Resp 18 | Ht 69.0 in | Wt 158.0 lb

## 2016-12-20 DIAGNOSIS — I83891 Varicose veins of right lower extremities with other complications: Secondary | ICD-10-CM | POA: Diagnosis not present

## 2016-12-20 NOTE — Addendum Note (Signed)
Addended by: Lianne Cure A on: 12/20/2016 05:12 PM   Modules accepted: Orders

## 2016-12-20 NOTE — Progress Notes (Signed)
Subjective:     Patient ID: Ryan Knapp, male   DOB: 02/23/1961, 56 y.o.   MRN: [id number]  HPI This 56 year old male was referred by Dr. Abbott Pao for evaluation of painful varicose veins in the right leg. Patient states he has had these bulges for many years but they have become much larger recently. He has aching and throbbing discomfort during the day and at night sometimes requiring him to get up and ambulate during the night. He has tried elastic compression stockings without improvement in his symptoms. He also has bilateral neuropathy with some numbness and tingling in both feet. He does not have diabetes mellitus. He has no history of DVT thrombophlebitis stasis ulcers or bleeding. His right leg symptoms are becoming worse and are affecting his daily living.  Past Medical History:  Diagnosis Date  . Allergic rhinitis   . Cancer ([place])   . Chest pain   . Dyslipidemia   . Epistaxis    req cauterization in April 2009  . Hemorrhoids   . Hepatic hemangioma   . Hiatal hernia   . Hx of thyroid cancer    Radioactive iodine treatment  . Tension headache     Social History  Substance Use Topics  . Smoking status: Former Smoker    Quit date: 07/26/1984  . Smokeless tobacco: Never Used     Comment: quit 1986  . Alcohol use Yes     Comment: occasional red wine    Family History  Problem Relation Age of Onset  . Stroke Mother   . Heart disease Mother   . COPD Father   . Stroke Sister     No Known Allergies   Current Outpatient Prescriptions:  .  Calcium-Vitamin D (CALTRATE 600 PLUS-VIT D PO), Take 600 mg by mouth daily., Disp: , Rfl:  .  cyanocobalamin 1000 MCG tablet, Take 100 mcg by mouth daily., Disp: , Rfl:  .  levothyroxine (SYNTHROID, LEVOTHROID) 150 MCG tablet, Take 150 mcg by mouth daily.  , Disp: , Rfl:  .  Multiple Vitamin (MULTIVITAMIN) tablet, Take 1 tablet by mouth daily., Disp: , Rfl:  .  Omega-3 Fatty Acids (FISH OIL) 1000 MG CAPS, Take 1,000 mg by mouth daily.,  Disp: , Rfl:  .  vitamin C (ASCORBIC ACID) 500 MG tablet, Take 500 mg by mouth daily., Disp: , Rfl:  .  ALPRAZolam (XANAX) 0.5 MG tablet, Take 0.5-1 tablets (0.25-0.5 mg total) by mouth 2 (two) times daily as needed for sleep or anxiety. (Patient not taking: Reported on 12/20/2016), Disp: 30 tablet, Rfl: 0 .  pregabalin (LYRICA) 50 MG capsule, Take 250 mg by mouth daily., Disp: , Rfl:   Vitals:   12/20/16 1446 12/20/16 1448  BP: (!) 152/97 134/87  Pulse: 69 69  Resp: 18   Temp: 98.9 F (37.2 C)   SpO2: 98%   Weight: 158 lb (71.7 kg)   Height: 5\' 9"  (1.753 m)     Body mass index is 23.33 kg/m.         Review of Systems Denies chest pain, dyspnea on exertion, PND, orthopnea. See history of present illness.    Objective:   Physical Exam BP 134/87 (BP Location: Left Arm, Patient Position: Sitting, Cuff Size: Normal)   Pulse 69   Temp 98.9 F (37.2 C)   Resp 18   Ht 5\' 9"  (1.753 m)   Wt 158 lb (71.7 kg)   SpO2 98%   BMI 23.33 kg/m     Gen.-alert  and oriented x3 in no apparent distress HEENT normal for age Lungs no rhonchi or wheezing Cardiovascular regular rhythm no murmurs carotid pulses 3+ palpable no bruits audible Abdomen soft nontender no palpable masses Musculoskeletal free of  major deformities Skin clear -no rashes Neurologic normal Lower extremities 3+ femoral and dorsalis pedis pulses palpable bilaterally with no edema on the left trace edema on the right Bulging varicosities in the medial distal thigh and medial calf with bruising overlying this. Tender to touch. No hyperpigmentation or ulceration noted distally. Left leg with early bulging varicosities in the medial calf over the great saphenous system with no hyperpigmentation or ulceration.  Today I performed a bedside SonoSite ultrasound exam. 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

## 2016-12-20 NOTE — Progress Notes (Signed)
Vitals:   12/20/16 1446  BP: (!) 152/97  Pulse: 69  Resp: 18  Temp: 98.9 F (37.2 C)  SpO2: 98%  Weight: 158 lb (71.7 kg)  Height: 5\' 9"  (1.753 m)

## 2017-03-07 ENCOUNTER — Encounter: Payer: Self-pay | Admitting: Vascular Surgery

## 2017-03-28 ENCOUNTER — Ambulatory Visit (HOSPITAL_COMMUNITY)
Admission: RE | Admit: 2017-03-28 | Discharge: 2017-03-28 | Disposition: A | Discharge status: Home / Self Care | Payer: BLUE CROSS/BLUE SHIELD | Source: Ambulatory Visit | Attending: Vascular Surgery | Admitting: Vascular Surgery

## 2017-03-28 ENCOUNTER — Ambulatory Visit (INDEPENDENT_AMBULATORY_CARE_PROVIDER_SITE_OTHER): Payer: BLUE CROSS/BLUE SHIELD | Admitting: Vascular Surgery

## 2017-03-28 ENCOUNTER — Encounter: Payer: Self-pay | Admitting: Vascular Surgery

## 2017-03-28 VITALS — BP 128/84 | HR 69 | Temp 97.1°F | Resp 18 | Ht 68.25 in | Wt 162.8 lb

## 2017-03-28 DIAGNOSIS — M7989 Other specified soft tissue disorders: Secondary | ICD-10-CM | POA: Diagnosis present

## 2017-03-28 DIAGNOSIS — I83891 Varicose veins of right lower extremities with other complications: Secondary | ICD-10-CM

## 2017-03-28 NOTE — Progress Notes (Signed)
Subjective:     Patient ID: Ryan Knapp, male   DOB: 1960/09/02, 56 y.o.   MRN: [id number]  HPI This 56 year old male returns for 3 month follow-up regarding his painful varicosities and swelling in the right leg. He is tried long-leg elastic compression stockings 20-30 millimeter gradient as well as elevation and ibuprofen with no improvement in his symptoms. This is affecting his daily living and he would like treatment. He has no history of DVT.  Past Medical History:  Diagnosis Date  . Allergic rhinitis   . Cancer ([place])   . Chest pain   . Dyslipidemia   . Epistaxis    req cauterization in April 2009  . Hemorrhoids   . Hepatic hemangioma   . Hiatal hernia   . Hx of thyroid cancer    Radioactive iodine treatment  . Tension headache     Social History  Substance Use Topics  . Smoking status: Former Smoker    Quit date: 07/26/1984  . Smokeless tobacco: Never Used     Comment: quit 1986  . Alcohol use Yes     Comment: occasional red wine    Family History  Problem Relation Age of Onset  . Stroke Mother   . Heart disease Mother   . COPD Father   . Stroke Sister     No Known Allergies   Current Outpatient Prescriptions:  .  Calcium-Vitamin D (CALTRATE 600 PLUS-VIT D PO), Take 600 mg by mouth daily., Disp: , Rfl:  .  levothyroxine (SYNTHROID, LEVOTHROID) 150 MCG tablet, Take 137 mcg by mouth daily. , Disp: , Rfl:  .  LORazepam (ATIVAN) 1 MG tablet, Take 1 mg by mouth every 8 (eight) hours as needed for anxiety., Disp: , Rfl:  .  Multiple Vitamin (MULTIVITAMIN) tablet, Take 1 tablet by mouth daily., Disp: , Rfl:  .  Omega-3 Fatty Acids (FISH OIL) 1000 MG CAPS, Take 1,000 mg by mouth daily., Disp: , Rfl:  .  vitamin C (ASCORBIC ACID) 500 MG tablet, Take 500 mg by mouth daily., Disp: , Rfl:  .  ALPRAZolam (XANAX) 0.5 MG tablet, Take 0.5-1 tablets (0.25-0.5 mg total) by mouth 2 (two) times daily as needed for sleep or anxiety. (Patient not taking: Reported on 12/20/2016),  Disp: 30 tablet, Rfl: 0 .  cyanocobalamin 1000 MCG tablet, Take 100 mcg by mouth daily., Disp: , Rfl:  .  pregabalin (LYRICA) 50 MG capsule, Take 250 mg by mouth daily., Disp: , Rfl:   Vitals:   03/28/17 1453 03/28/17 1455  BP: (!) 157/85 128/84  Pulse: 69   Resp: 18   Temp: (!) 97.1 F (36.2 C)   TempSrc: Oral   SpO2: 98%   Weight: 162 lb 12.8 oz (73.8 kg)   Height: 5' 8.25" (1.734 m)     Body mass index is 24.57 kg/m.         Review of Systems Denies chest pain, dyspnea on exertion, PND, orthopnea, hemoptysis, claudication    Objective:   Physical Exam BP 128/84 (BP Location: Left Arm, Patient Position: Sitting, Cuff Size: Normal)   Pulse 69   Temp (!) 97.1 F (36.2 C) (Oral)   Resp 18   Ht 5' 8.25" (1.734 m)   Wt 162 lb 12.8 oz (73.8 kg)   SpO2 98%   BMI 24.57 kg/m   Gen. well-developed well-nourished male no apparent distress alert and oriented 3 Lungs no rhonchi or wheezing Right leg with bulging varicosities beginning in the very distal medial  thigh stenting into the medial calf and in the posterior calf with 1+ distal edema. No hyperpigmentation or ulceration noted. 3+ dorsalis pedis pulse palpable.  Today I ordered bilateral venous reflux exam which I reviewed and interpreted. There is no DVT bilaterally. There is gross reflux and bilateral great saphenous veins but the larger vein is on the right side supplying the painful varicosities     Assessment:     #1 painful varicosities with swelling right leg due to gross reflux right great saphenous vein causing symptoms which are affecting patient's daily living and resistant to conservative measures including long-leg elastic compression stockings 20-30 millimeter gradient, elevation, and ibuprofen.    Plan:     Patient needs laser ablation right great saphenous vein +10-20 stab phlebectomy of painful varicosities to be performed as a single procedure We'll proceed with precertification to perform this in  the near future and relieve his symptoms

## 2017-04-14 ENCOUNTER — Other Ambulatory Visit: Payer: Self-pay | Admitting: *Deleted

## 2017-04-14 DIAGNOSIS — I83891 Varicose veins of right lower extremities with other complications: Secondary | ICD-10-CM

## 2017-05-08 ENCOUNTER — Ambulatory Visit: Payer: BLUE CROSS/BLUE SHIELD | Admitting: Vascular Surgery

## 2017-05-08 ENCOUNTER — Encounter: Payer: Self-pay | Admitting: Vascular Surgery

## 2017-05-08 VITALS — BP 114/74 | HR 66 | Temp 97.2°F | Resp 16 | Ht 68.25 in | Wt 157.2 lb

## 2017-05-08 DIAGNOSIS — I83891 Varicose veins of right lower extremities with other complications: Secondary | ICD-10-CM

## 2017-05-08 HISTORY — PX: ENDOVENOUS ABLATION SAPHENOUS VEIN W/ LASER: SUR449

## 2017-05-08 NOTE — Progress Notes (Signed)
Laser Ablation Procedure    Date: 05/08/2017   Ryan Knapp DOB:[birth year]  Consent signed: Yes    Surgeon:  Dr. Nelda Severe. Kellie Simmering  Procedure: Laser Ablation: right Greater Saphenous Vein  BP 114/74 (BP Location: Left Arm, Patient Position: Sitting, Cuff Size: Normal)   Pulse 66   Temp (!) 97.2 F (36.2 C) (Oral)   Resp 16   Ht 5' 8.25" (1.734 m)   Wt 157 lb 3.2 oz (71.3 kg)   SpO2 99%   BMI 23.73 kg/m   Tumescent Anesthesia: 425 cc 0.9% NaCl with 50 cc Lidocaine HCL  and 15 cc 8.4% NaHCO3  Local Anesthesia: 5 cc Lidocaine HCL and NaHCO3 (ratio 2:1)  Pulsed Mode: 15 watts, 53ms delay, 1.0 duration  Total Energy: 2320 Joules             Total Pulses: 155               Total Time: 2:35    Stab Phlebectomy: 10-20 Sites: Calf  Patient tolerated procedure well    Description of Procedure:  After marking the course of the secondary varicosities, the patient was placed on the operating table in the supine position, and the right leg was prepped and draped in sterile fashion.   Local anesthetic was administered and under ultrasound guidance the saphenous vein was accessed with a micro needle and guide wire; then the mirco puncture sheath was placed.  A guide wire was inserted saphenofemoral junction , followed by a 5 french sheath.  The position of the sheath and then the laser fiber below the junction was confirmed using the ultrasound.  Tumescent anesthesia was administered along the course of the saphenous vein using ultrasound guidance. The patient was placed in Trendelenburg position and protective laser glasses were placed on patient and staff, and the laser was fired at 15 watts continuous mode advancing 1-71mm/second for a total of 2320 joules.   For stab phlebectomies, local anesthetic was administered at the previously marked varicosities, and tumescent anesthesia was administered around the vessels.  Ten to 20 stab wounds were made using the tip of an 11 blade. And using the  vein hook, the phlebectomies were performed using a hemostat to avulse the varicosities.  Adequate hemostasis was achieved.     Steri strips were applied to the stab wounds and ABD pads and thigh high compression stockings were applied.  Ace wrap bandages were applied over the phlebectomy sites and at the top of the saphenofemoral junction. Blood loss was less than 15 cc.  The patient ambulated out of the operating room having tolerated the procedure well.

## 2017-05-08 NOTE — Progress Notes (Signed)
Subjective:     Patient ID: Ryan Knapp, male   DOB: 03/30/1961, 56 y.o.   MRN: [id number]  HPI This 56 year old male had laser ablation of the right great saphenous vein from the proximal calf to near the saphenofemoral junction +10-20 stab phlebectomy of painful varicosities both performed under local tumescent anesthesia. A total of 2320 J of energy was utilized. He tolerated the procedures well.  Review of Systems     Objective:   Physical Exam BP 114/74 (BP Location: Left Arm, Patient Position: Sitting, Cuff Size: Normal)   Pulse 66   Temp (!) 97.2 F (36.2 C) (Oral)   Resp 16   Ht 5' 8.25" (1.734 m)   Wt 157 lb 3.2 oz (71.3 kg)   SpO2 99%   BMI 23.73 kg/m        Assessment:     Well-tolerated laser ablation right great saphenous vein plus multiple stab phlebectomy of painful varicosities performed under local tumescent anesthesia    Plan:     Return in 1 week for venous duplex exam to confirm closure right great saphenous vein and this will complete patient's treatment regimen

## 2017-05-16 ENCOUNTER — Ambulatory Visit (INDEPENDENT_AMBULATORY_CARE_PROVIDER_SITE_OTHER): Payer: BLUE CROSS/BLUE SHIELD | Admitting: Vascular Surgery

## 2017-05-16 ENCOUNTER — Ambulatory Visit (HOSPITAL_COMMUNITY)
Admission: RE | Admit: 2017-05-16 | Discharge: 2017-05-16 | Disposition: A | Discharge status: Home / Self Care | Payer: BLUE CROSS/BLUE SHIELD | Source: Ambulatory Visit | Attending: Vascular Surgery | Admitting: Vascular Surgery

## 2017-05-16 ENCOUNTER — Encounter: Payer: Self-pay | Admitting: Vascular Surgery

## 2017-05-16 VITALS — BP 139/90 | HR 61 | Temp 98.0°F | Resp 20 | Ht 68.25 in | Wt 157.0 lb

## 2017-05-16 DIAGNOSIS — Z9889 Other specified postprocedural states: Secondary | ICD-10-CM | POA: Insufficient documentation

## 2017-05-16 DIAGNOSIS — I83891 Varicose veins of right lower extremities with other complications: Secondary | ICD-10-CM

## 2017-05-16 NOTE — Progress Notes (Signed)
Subjective:     Patient ID: Ryan Knapp, male   DOB: 10-Aug-1960, 56 y.o.   MRN: [id number]  HPI This 56 year old male had laser ablation multiple stab phlebectomy painful varicosities right leg performed 1 week ago and returns today for follow-up. He has worn his elastic compression stocking and taken ibuprofen for only 2 days and then discontinued it because of no pain. He has had no distal edema. He has no specific complaints.  Past Medical History:  Diagnosis Date  . Allergic rhinitis   . Cancer ([place])   . Chest pain   . Dyslipidemia   . Epistaxis    req cauterization in April 2009  . Hemorrhoids   . Hepatic hemangioma   . Hiatal hernia   . Hx of thyroid cancer    Radioactive iodine treatment  . Tension headache     Social History   Tobacco Use  . Smoking status: Former Smoker    Last attempt to quit: 07/26/1984    Years since quitting: 32.8  . Smokeless tobacco: Never Used  . Tobacco comment: quit 1986  Substance Use Topics  . Alcohol use: Yes    Comment: occasional red wine    Family History  Problem Relation Age of Onset  . Stroke Mother   . Heart disease Mother   . COPD Father   . Stroke Sister     No Known Allergies   Current Outpatient Medications:  .  Calcium-Vitamin D (CALTRATE 600 PLUS-VIT D PO), Take 600 mg by mouth daily., Disp: , Rfl:  .  cyanocobalamin 1000 MCG tablet, Take 100 mcg by mouth daily., Disp: , Rfl:  .  levothyroxine (SYNTHROID, LEVOTHROID) 150 MCG tablet, Take 137 mcg by mouth daily. , Disp: , Rfl:  .  LORazepam (ATIVAN) 1 MG tablet, Take 1 mg by mouth every 8 (eight) hours as needed for anxiety., Disp: , Rfl:  .  Multiple Vitamin (MULTIVITAMIN) tablet, Take 1 tablet by mouth daily., Disp: , Rfl:  .  Omega-3 Fatty Acids (FISH OIL) 1000 MG CAPS, Take 1,000 mg by mouth daily., Disp: , Rfl:  .  vitamin C (ASCORBIC ACID) 500 MG tablet, Take 500 mg by mouth daily., Disp: , Rfl:  .  ALPRAZolam (XANAX) 0.5 MG tablet, Take 0.5-1 tablets  (0.25-0.5 mg total) by mouth 2 (two) times daily as needed for sleep or anxiety. (Patient not taking: Reported on 05/16/2017), Disp: 30 tablet, Rfl: 0 .  pregabalin (LYRICA) 50 MG capsule, Take 250 mg by mouth daily., Disp: , Rfl:   Vitals:   05/16/17 1037  BP: 139/90  Pulse: 61  Resp: 20  Temp: 98 F (36.7 C)  TempSrc: Oral  SpO2: 98%  Weight: 157 lb (71.2 kg)  Height: 5' 8.25" (1.734 m)    Body mass index is 23.7 kg/m.         Review of Systems Denies chest pain, dyspnea on exertion, PND, orthopnea, hemoptysis    Objective:   Physical Exam BP 139/90 (BP Location: Left Arm, Patient Position: Sitting, Cuff Size: Normal)   Pulse 61   Temp 98 F (36.7 C) (Oral)   Resp 20   Ht 5' 8.25" (1.734 m)   Wt 157 lb (71.2 kg)   SpO2 98%   BMI 23.70 kg/m   Gen. well-developed well-nourished male no apparent distress alert and oriented 3 Lungs no rhonchi or wheezing Right leg with stab phlebectomy sites healing nicely with only 2 Steri-Strips remaining in place. No distal edema noted. Minimal  bruising along great saphenous vein area and thigh and minimal tenderness to deep palpation  Today I ordered a venous duplex exam the right leg which I reviewed and interpreted. There is no DVT. There is total closure of the right great saphenous vein from the proximal calf to near the saphenofemoral junction    Assessment:     Successful laser ablation right great saphenous vein with multiple stab phlebectomy of painful varicosities with excellent early results    Plan:     Patient will wear elastic compression stocking for 1 more week and may resume driving his vehicle next week continuing to wear stockings as needed Return to see me when necessary basis

## 2019-02-01 ENCOUNTER — Encounter (HOSPITAL_COMMUNITY): Payer: Self-pay

## 2019-02-01 ENCOUNTER — Emergency Department (HOSPITAL_COMMUNITY): Payer: Self-pay

## 2019-02-01 ENCOUNTER — Emergency Department (HOSPITAL_COMMUNITY)
Admission: EM | Admit: 2019-02-01 | Discharge: 2019-02-02 | Disposition: A | Discharge status: Home / Self Care | Payer: Self-pay | Attending: Emergency Medicine | Admitting: Emergency Medicine

## 2019-02-01 ENCOUNTER — Other Ambulatory Visit: Payer: Self-pay

## 2019-02-01 DIAGNOSIS — Z87891 Personal history of nicotine dependence: Secondary | ICD-10-CM | POA: Insufficient documentation

## 2019-02-01 DIAGNOSIS — R079 Chest pain, unspecified: Secondary | ICD-10-CM

## 2019-02-01 DIAGNOSIS — R072 Precordial pain: Secondary | ICD-10-CM | POA: Insufficient documentation

## 2019-02-01 DIAGNOSIS — Z79899 Other long term (current) drug therapy: Secondary | ICD-10-CM | POA: Insufficient documentation

## 2019-02-01 DIAGNOSIS — R0600 Dyspnea, unspecified: Secondary | ICD-10-CM | POA: Insufficient documentation

## 2019-02-01 LAB — BASIC METABOLIC PANEL
Anion gap: 9 (ref 5–15)
BUN: 9 mg/dL (ref 6–20)
CO2: 26 mmol/L (ref 22–32)
Calcium: 9.2 mg/dL (ref 8.9–10.3)
Chloride: 101 mmol/L (ref 98–111)
Creatinine, Ser: 0.74 mg/dL (ref 0.61–1.24)
GFR calc Af Amer: >60 mL/min (ref >60)
GFR calc non Af Amer: >60 mL/min (ref >60)
Glucose, Bld: 112 mg/dL — ABNORMAL HIGH (ref 70–99)
Potassium: 3.4 mmol/L — ABNORMAL LOW (ref 3.5–5.1)
Sodium: 136 mmol/L (ref 135–145)

## 2019-02-01 LAB — CBC
HCT: 41.7 % (ref 39.0–52.0)
Hemoglobin: 14 g/dL (ref 13.0–17.0)
MCH: 31 pg (ref 26.0–34.0)
MCHC: 33.6 g/dL (ref 30.0–36.0)
MCV: 92.5 fL (ref 80.0–100.0)
Platelets: 230 10*3/uL (ref 150–400)
RBC: 4.51 MIL/uL (ref 4.22–5.81)
RDW: 12.1 % (ref 11.5–15.5)
WBC: 5.4 10*3/uL (ref 4.0–10.5)
nRBC: 0 % (ref 0.0–0.2)

## 2019-02-01 LAB — TROPONIN I (HIGH SENSITIVITY): Troponin I (High Sensitivity): 3 ng/L (ref <18)

## 2019-02-01 MED ORDER — IOHEXOL 350 MG/ML SOLN
75.0000 mL | Freq: Once | INTRAVENOUS | Status: AC | PRN
  Administered 2019-02-01: 75 mL via INTRAVENOUS

## 2019-02-01 MED ORDER — SODIUM CHLORIDE 0.9% FLUSH: 3.0000 mL | Freq: Once | INTRAVENOUS | Status: DC

## 2019-02-01 MED ORDER — LORAZEPAM 2 MG/ML IJ SOLN
0.5000 mg | Freq: Once | INTRAMUSCULAR | Status: AC
  Administered 2019-02-02: 0.5 mg via INTRAVENOUS
  Filled 2019-02-01: qty 1

## 2019-02-01 MED ORDER — OXYCODONE-ACETAMINOPHEN 5-325 MG PO TABS
1.0000 | ORAL_TABLET | Freq: Once | ORAL | Status: AC
  Administered 2019-02-01: 1 via ORAL
  Filled 2019-02-01: qty 1

## 2019-02-01 MED ORDER — PANTOPRAZOLE SODIUM 20 MG PO TBEC: 20.0000 mg | DELAYED_RELEASE_TABLET | Freq: Every day | ORAL | 0 refills | Status: DC

## 2019-02-01 NOTE — Discharge Instructions (Addendum)
No evidence of heart attack or blood clot in your lungs on work up. Please recheck with your doctor next week.

## 2019-02-01 NOTE — ED Triage Notes (Signed)
Pt states that he has been having central CP and congestion, dry cough, for the past three weeks, pain is worse today, radiation to R arm, some SOB

## 2019-02-01 NOTE — ED Notes (Signed)
Patient transported to X-ray 

## 2019-02-01 NOTE — ED Provider Notes (Signed)
[facility] Provider Note   CSN: [id number] Arrival date & time: 02/01/19  2049     History   Chief Complaint Chief Complaint  Patient presents with  . Chest Pain    HPI Ryan Knapp is a 58 y.o. male.     HPI  58 yo male ho of complaining of chest pain since July 19.  Pain is substernal and pressure with associated dyspnea.but states pain constant since.  He denies nausea, vomiting, weight loss.  He denies anything that makes it worse.  Pain improves when he walks or runs.    Past Medical History:  Diagnosis Date  . Allergic rhinitis   . Cancer ([place])   . Chest pain   . Dyslipidemia   . Epistaxis    req cauterization in April 2009  . Hemorrhoids   . Hepatic hemangioma   . Hiatal hernia   . Hx of thyroid cancer    Radioactive iodine treatment  . Tension headache     Patient Active Problem List   Diagnosis Date Noted  . Varicose veins of right lower extremity with complications 82/50/5397  . Disturbance of skin sensation 07/26/2013    Past Surgical History:  Procedure Laterality Date  . CHOLECYSTECTOMY    . ENDOVENOUS ABLATION SAPHENOUS VEIN W/ LASER Right 05/08/2017   endovenous laser ablation R GSV and stab phlebectomy 10-20 incisions R leg by Tinnie Gens MD   . [facility] Medications    Prior to Admission medications   Medication Sig Start Date End Date Taking? Authorizing Provider  ALPRAZolam Duanne Moron) 0.5 MG tablet Take 0.5-1 tablets (0.25-0.5 mg total) by mouth 2 (two) times daily as needed for sleep or anxiety. Patient not taking: Reported on 05/16/2017 02/05/13   Wardell Honour, MD  Calcium-Vitamin D (CALTRATE 600 PLUS-VIT D PO) Take 600 mg by mouth daily.    [provider]  cyanocobalamin 1000 MCG tablet Take 100 mcg by mouth daily.    [provider]  levothyroxine (SYNTHROID, LEVOTHROID) 150 MCG tablet Take 137 mcg by mouth daily.     [provider]   LORazepam (ATIVAN) 1 MG tablet Take 1 mg by mouth every 8 (eight) hours as needed for anxiety.    [provider]  Multiple Vitamin (MULTIVITAMIN) tablet Take 1 tablet by mouth daily.    [provider]  Omega-3 Fatty Acids (FISH OIL) 1000 MG CAPS Take 1,000 mg by mouth daily.    [provider]  pregabalin (LYRICA) 50 MG capsule Take 250 mg by mouth daily.    [provider]  vitamin C (ASCORBIC ACID) 500 MG tablet Take 500 mg by mouth daily.    [provider]    Family History Family History  Problem Relation Age of Onset  . Stroke Mother   . Heart disease Mother   . COPD Father   . Stroke Sister     Social History Social History   Tobacco Use  . Smoking status: Former Smoker    Quit date: 07/26/1984    Years since quitting: 34.5  . Smokeless tobacco: Never Used  . Tobacco comment: quit 1986  Substance Use Topics  . Alcohol use: Yes    Comment: occasional red wine  . Drug use: No    Comment: quit 1986     Allergies   Patient has no known allergies.   Review of Systems Review of Systems  All other systems reviewed and are negative.    Physical Exam Updated Vital Signs BP (!) 150/89   Pulse 73   Temp 98.3 F (36.8 C) (Oral)   Resp 18   SpO2 99%   Physical Exam Vitals signs and nursing note reviewed.  Constitutional:      General: He is not in acute distress.    Appearance: He is well-developed and normal weight. He is not ill-appearing.  HENT:     Head: Normocephalic.  Eyes:     Pupils: Pupils are equal, round, and reactive to light.  Neck:     Musculoskeletal: Normal range of motion.  Cardiovascular:     Rate and Rhythm: Normal rate and regular rhythm.     Heart sounds: Normal heart sounds.  Pulmonary:     Effort: Pulmonary effort is normal.     Breath sounds: Normal breath sounds.  Abdominal:     General: Bowel sounds are normal.     Palpations: Abdomen is soft.  Musculoskeletal: Normal range of  motion.  Skin:    General: Skin is warm.     Capillary Refill: Capillary refill takes less than 2 seconds.  Neurological:     General: No focal deficit present.     Mental Status: He is alert.     Cranial Nerves: No cranial nerve deficit.  Psychiatric:        Mood and Affect: Mood normal.      ED Treatments / Results  Labs (all labs ordered are listed, but only abnormal results are displayed) Labs Reviewed  BASIC METABOLIC PANEL  CBC  TROPONIN I (HIGH SENSITIVITY)    EKG EKG Interpretation  Date/Time:  Friday February 01 2019 20:53:38 EDT Ventricular Rate:  74 PR Interval:  168 QRS Duration: 100 QT Interval:  382 QTC Calculation: 424 R Axis:   53 Text Interpretation:  Normal sinus rhythm Incomplete right bundle branch block Borderline ECG st elevation v2-v4 Confirmed by Pattricia Boss [phone]) on 02/01/2019 8:58:04 PM   Radiology No results found.  Procedures Procedures (including critical care time)  Medications Ordered in ED Medications  sodium chloride flush (NS) 0.9 % injection 3 mL (has no administration in time range)     Initial Impression / Assessment and Plan / ED Course  I have reviewed the triage vital signs and the nursing notes.  Pertinent labs & imaging results that were available during my care of the patient were reviewed by me and considered in my medical decision making (see chart for details).      Patient with chest pain for 3 weeks. No ischemic changes on ekg Labs normal Ct chest without pe or other acute changes Will treat for possible gi etiology Patient appears stable for discharge Discussed return precautions and need for close follow-up and he voices understanding    Final Clinical Impressions(s) / ED Diagnoses   Final diagnoses:  Nonspecific chest pain    ED Discharge Orders    None       Pattricia Boss, MD 02/01/19 2347

## 2019-02-02 NOTE — ED Notes (Signed)
Patient verbalizes understanding of discharge instructions. Opportunity for questioning and answers were provided. Armband removed by staff, pt discharged from ED.  

## 2019-04-29 ENCOUNTER — Other Ambulatory Visit: Payer: Self-pay | Admitting: Orthopedic Surgery

## 2019-05-30 ENCOUNTER — Encounter (HOSPITAL_BASED_OUTPATIENT_CLINIC_OR_DEPARTMENT_OTHER): Payer: Self-pay

## 2019-05-30 ENCOUNTER — Other Ambulatory Visit: Payer: Self-pay

## 2019-06-03 ENCOUNTER — Other Ambulatory Visit (HOSPITAL_COMMUNITY)
Admission: RE | Admit: 2019-06-03 | Discharge: 2019-06-03 | Disposition: A | Discharge status: Home / Self Care | Payer: Commercial Managed Care - PPO | Source: Ambulatory Visit | Attending: Orthopedic Surgery | Admitting: Orthopedic Surgery

## 2019-06-03 DIAGNOSIS — Z01812 Encounter for preprocedural laboratory examination: Secondary | ICD-10-CM | POA: Diagnosis present

## 2019-06-03 DIAGNOSIS — Z20828 Contact with and (suspected) exposure to other viral communicable diseases: Secondary | ICD-10-CM | POA: Insufficient documentation

## 2019-06-03 NOTE — Progress Notes (Signed)

## 2019-06-04 LAB — NOVEL CORONAVIRUS, NAA (HOSP ORDER, SEND-OUT TO REF LAB; TAT 18-24 HRS): SARS-CoV-2, NAA: NOT DETECTED

## 2019-06-06 ENCOUNTER — Ambulatory Visit (HOSPITAL_BASED_OUTPATIENT_CLINIC_OR_DEPARTMENT_OTHER)
Admission: RE | Admit: 2019-06-06 | Discharge: 2019-06-06 | Disposition: A | Discharge status: Home / Self Care | Payer: Commercial Managed Care - PPO | Attending: Orthopedic Surgery | Admitting: Orthopedic Surgery

## 2019-06-06 ENCOUNTER — Encounter (HOSPITAL_BASED_OUTPATIENT_CLINIC_OR_DEPARTMENT_OTHER)
Admission: RE | Disposition: A | Discharge status: Home / Self Care | Payer: Self-pay | Source: Home / Self Care | Attending: Orthopedic Surgery

## 2019-06-06 ENCOUNTER — Ambulatory Visit (HOSPITAL_BASED_OUTPATIENT_CLINIC_OR_DEPARTMENT_OTHER): Payer: Commercial Managed Care - PPO | Admitting: Certified Registered"

## 2019-06-06 ENCOUNTER — Encounter (HOSPITAL_BASED_OUTPATIENT_CLINIC_OR_DEPARTMENT_OTHER): Payer: Self-pay | Admitting: Orthopedic Surgery

## 2019-06-06 ENCOUNTER — Other Ambulatory Visit: Payer: Self-pay

## 2019-06-06 DIAGNOSIS — K219 Gastro-esophageal reflux disease without esophagitis: Secondary | ICD-10-CM | POA: Insufficient documentation

## 2019-06-06 DIAGNOSIS — M25841 Other specified joint disorders, right hand: Secondary | ICD-10-CM | POA: Diagnosis not present

## 2019-06-06 DIAGNOSIS — Z79890 Hormone replacement therapy (postmenopausal): Secondary | ICD-10-CM | POA: Insufficient documentation

## 2019-06-06 DIAGNOSIS — Z85850 Personal history of malignant neoplasm of thyroid: Secondary | ICD-10-CM | POA: Insufficient documentation

## 2019-06-06 DIAGNOSIS — E039 Hypothyroidism, unspecified: Secondary | ICD-10-CM | POA: Diagnosis not present

## 2019-06-06 DIAGNOSIS — Z79899 Other long term (current) drug therapy: Secondary | ICD-10-CM | POA: Insufficient documentation

## 2019-06-06 DIAGNOSIS — Z87891 Personal history of nicotine dependence: Secondary | ICD-10-CM | POA: Insufficient documentation

## 2019-06-06 DIAGNOSIS — M65331 Trigger finger, right middle finger: Secondary | ICD-10-CM | POA: Insufficient documentation

## 2019-06-06 HISTORY — PX: TRIGGER FINGER RELEASE: SHX641

## 2019-06-06 HISTORY — DX: Gastro-esophageal reflux disease without esophagitis: K21.9

## 2019-06-06 HISTORY — DX: Hypothyroidism, unspecified: E03.9

## 2019-06-06 SURGERY — RELEASE, A1 PULLEY, FOR TRIGGER FINGER
Anesthesia: Monitor Anesthesia Care | Site: Hand | Laterality: Right

## 2019-06-06 MED ORDER — FENTANYL CITRATE (PF) 100 MCG/2ML IJ SOLN
INTRAMUSCULAR | Status: DC | PRN
  Administered 2019-06-06 (×2): 25 ug via INTRAVENOUS
  Administered 2019-06-06: 50 ug via INTRAVENOUS

## 2019-06-06 MED ORDER — ONDANSETRON HCL 4 MG/2ML IJ SOLN: 4.0000 mg | Freq: Once | INTRAMUSCULAR | Status: DC | PRN

## 2019-06-06 MED ORDER — CEFAZOLIN SODIUM-DEXTROSE 2-4 GM/100ML-% IV SOLN
2.0000 g | INTRAVENOUS | Status: AC
  Administered 2019-06-06: 2 g via INTRAVENOUS

## 2019-06-06 MED ORDER — BUPIVACAINE HCL (PF) 0.25 % IJ SOLN
INTRAMUSCULAR | Status: DC | PRN
  Administered 2019-06-06: 8 mL

## 2019-06-06 MED ORDER — OXYCODONE HCL 5 MG PO TABS: 5.0000 mg | ORAL_TABLET | Freq: Once | ORAL | Status: DC | PRN

## 2019-06-06 MED ORDER — MIDAZOLAM HCL 2 MG/2ML IJ SOLN
INTRAMUSCULAR | Status: AC
  Filled 2019-06-06: qty 2

## 2019-06-06 MED ORDER — OXYCODONE HCL 5 MG/5ML PO SOLN: 5.0000 mg | Freq: Once | ORAL | Status: DC | PRN

## 2019-06-06 MED ORDER — FENTANYL CITRATE (PF) 100 MCG/2ML IJ SOLN: 25.0000 ug | INTRAMUSCULAR | Status: DC | PRN

## 2019-06-06 MED ORDER — MIDAZOLAM HCL 5 MG/5ML IJ SOLN
INTRAMUSCULAR | Status: DC | PRN
  Administered 2019-06-06: 2 mg via INTRAVENOUS

## 2019-06-06 MED ORDER — LIDOCAINE HCL (PF) 0.5 % IJ SOLN
INTRAMUSCULAR | Status: DC | PRN
  Administered 2019-06-06: 35 mL via INTRAVENOUS

## 2019-06-06 MED ORDER — FENTANYL CITRATE (PF) 100 MCG/2ML IJ SOLN
INTRAMUSCULAR | Status: AC
  Filled 2019-06-06: qty 2

## 2019-06-06 MED ORDER — CHLORHEXIDINE GLUCONATE 4 % EX LIQD: 60.0000 mL | Freq: Once | CUTANEOUS | Status: DC

## 2019-06-06 MED ORDER — LIDOCAINE 2% (20 MG/ML) 5 ML SYRINGE
INTRAMUSCULAR | Status: DC | PRN
  Administered 2019-06-06: 40 mg via INTRAVENOUS

## 2019-06-06 MED ORDER — PROPOFOL 500 MG/50ML IV EMUL
INTRAVENOUS | Status: DC | PRN
  Administered 2019-06-06: 50 ug/kg/min via INTRAVENOUS

## 2019-06-06 MED ORDER — ONDANSETRON HCL 4 MG/2ML IJ SOLN
INTRAMUSCULAR | Status: DC | PRN
  Administered 2019-06-06: 4 mg via INTRAVENOUS

## 2019-06-06 MED ORDER — LACTATED RINGERS IV SOLN
INTRAVENOUS | Status: DC
  Administered 2019-06-06: 10:00:00 via INTRAVENOUS

## 2019-06-06 MED ORDER — HYDROCODONE-ACETAMINOPHEN 5-325 MG PO TABS: ORAL_TABLET | ORAL | 0 refills | Status: AC

## 2019-06-06 MED ORDER — CEFAZOLIN SODIUM-DEXTROSE 2-4 GM/100ML-% IV SOLN
INTRAVENOUS | Status: AC
  Filled 2019-06-06: qty 100

## 2019-06-06 SURGICAL SUPPLY — 36 items
APL PRP STRL LF DISP 70% ISPRP (MISCELLANEOUS) ×1
BLADE SURG 15 STRL LF DISP TIS (BLADE) ×2 IMPLANT
BLADE SURG 15 STRL SS (BLADE) ×6
BNDG CMPR 9X4 STRL LF SNTH (GAUZE/BANDAGES/DRESSINGS) ×1
BNDG COHESIVE 2X5 TAN STRL LF (GAUZE/BANDAGES/DRESSINGS) ×3 IMPLANT
BNDG ESMARK 4X9 LF (GAUZE/BANDAGES/DRESSINGS) ×2 IMPLANT
CHLORAPREP W/TINT 26 (MISCELLANEOUS) ×3 IMPLANT
CORD BIPOLAR FORCEPS 12FT (ELECTRODE) ×3 IMPLANT
COVER BACK TABLE REUSABLE LG (DRAPES) ×3 IMPLANT
COVER MAYO STAND REUSABLE (DRAPES) ×3 IMPLANT
COVER WAND RF STERILE (DRAPES) IMPLANT
CUFF TOURN SGL QUICK 18X4 (TOURNIQUET CUFF) ×3 IMPLANT
DRAPE EXTREMITY T 121X128X90 (DISPOSABLE) ×3 IMPLANT
DRAPE SURG 17X23 STRL (DRAPES) ×3 IMPLANT
GAUZE SPONGE 4X4 12PLY STRL (GAUZE/BANDAGES/DRESSINGS) ×3 IMPLANT
GAUZE XEROFORM 1X8 LF (GAUZE/BANDAGES/DRESSINGS) ×3 IMPLANT
GLOVE BIO SURGEON STRL SZ 6.5 (GLOVE) ×1 IMPLANT
GLOVE BIO SURGEON STRL SZ7.5 (GLOVE) ×3 IMPLANT
GLOVE BIO SURGEONS STRL SZ 6.5 (GLOVE) ×1
GLOVE BIOGEL PI IND STRL 7.0 (GLOVE) IMPLANT
GLOVE BIOGEL PI IND STRL 8 (GLOVE) ×1 IMPLANT
GLOVE BIOGEL PI INDICATOR 7.0 (GLOVE) ×4
GLOVE BIOGEL PI INDICATOR 8 (GLOVE) ×2
GOWN STRL REUS W/ TWL LRG LVL3 (GOWN DISPOSABLE) ×1 IMPLANT
GOWN STRL REUS W/TWL LRG LVL3 (GOWN DISPOSABLE) ×3
GOWN STRL REUS W/TWL XL LVL3 (GOWN DISPOSABLE) ×3 IMPLANT
NDL HYPO 25X1 1.5 SAFETY (NEEDLE) ×1 IMPLANT
NEEDLE HYPO 25X1 1.5 SAFETY (NEEDLE) ×3 IMPLANT
NS IRRIG 1000ML POUR BTL (IV SOLUTION) ×3 IMPLANT
PACK BASIN DAY SURGERY FS (CUSTOM PROCEDURE TRAY) ×3 IMPLANT
STOCKINETTE 4X48 STRL (DRAPES) ×3 IMPLANT
SUT ETHILON 4 0 PS 2 18 (SUTURE) ×3 IMPLANT
SYR BULB 3OZ (MISCELLANEOUS) ×3 IMPLANT
SYR CONTROL 10ML LL (SYRINGE) ×3 IMPLANT
TOWEL GREEN STERILE FF (TOWEL DISPOSABLE) ×6 IMPLANT
UNDERPAD 30X36 HEAVY ABSORB (UNDERPADS AND DIAPERS) ×3 IMPLANT

## 2019-06-06 NOTE — H&P (Signed)
Ryan Knapp is an 58 y.o. male.   Chief Complaint: right long finger trigger digit and annular ligament cyst HPI: 58 yo male with triggering right long finger and nodule at volar aspect of finger at proximal flexion crease.  Trigger digit has been injected without lasting relief.  He wishes to have a trigger release and excision of the mass.  Allergies: No Known Allergies  Past Medical History:  Diagnosis Date  . Allergic rhinitis   . Cancer ([facility])   . Chest pain   . Dyslipidemia   . Epistaxis    req cauterization in April 2009  . GERD (gastroesophageal reflux disease)   . Hemorrhoids   . Hepatic hemangioma   . Hiatal hernia   . Hx of thyroid cancer    Radioactive iodine treatment  . Hypothyroidism   . Tension headache     Past Surgical History:  Procedure Laterality Date  . CHOLECYSTECTOMY    . ENDOVENOUS ABLATION SAPHENOUS VEIN W/ LASER Right 05/08/2017   endovenous laser ablation R GSV and stab phlebectomy 10-20 incisions R leg by Tinnie Gens MD   . THYROIDECTOMY      Family History: Family History  Problem Relation Age of Onset  . Stroke Mother   . Heart disease Mother   . COPD Father   . Stroke Sister     Social History:   reports that he quit smoking about 34 years ago. He has never used smokeless tobacco. He reports current alcohol use. He reports that he does not use drugs.  Medications: Medications Prior to Admission  Medication Sig Dispense Refill  . acetaminophen (TYLENOL) 500 MG tablet Take 1,000 mg by mouth every 6 (six) hours as needed for headache (pain).    . Ascorbic Acid (VITAMIN C) 500 MG CHEW Chew 500 mg by mouth daily as needed (immune system boost).    Marland Kitchen levothyroxine (SYNTHROID) 137 MCG tablet Take 137 mcg by mouth daily before breakfast.     . omeprazole (PRILOSEC) 20 MG capsule Take 20 mg by mouth daily.      No results found for this or any previous visit (from the past 48 hour(s)).  No results found.   A comprehensive review of  systems was negative.  Height 5\' 9"  (1.753 m), weight 68 kg.  General appearance: alert, cooperative and appears stated age Head: Normocephalic, without obvious abnormality, atraumatic Neck: supple, symmetrical, trachea midline Cardio: regular rate and rhythm Resp: clear to auscultation bilaterally Extremities: Intact sensation and capillary refill all digits.  +epl/fpl/io.  No wounds.  Pulses: 2+ and symmetric Skin: Skin color, texture, turgor normal. No rashes or lesions Neurologic: Grossly normal Incision/Wound: none  Assessment/Plan Right long finger trigger digit and annular ligament cyst.  Plan right long finger trigger release and excision of annular ligament cyst.  Non operative and operative treatment options have been discussed with the patient and patient wishes to proceed with operative treatment. Risks, benefits, and alternatives of surgery have been discussed and the patient agrees with the plan of care.   [name] 06/06/2019, 9:54 AM

## 2019-06-06 NOTE — Transfer of Care (Signed)
Immediate Anesthesia Transfer of Care Note  Patient: Ryan Knapp  Procedure(s) Performed: RELEASE TRIGGER FINGER/A-1 PULLEY RIGHT LONG FINGER, EXCISION CYST RIGHT LONG FINGER (Right Hand)  Patient Location: PACU  Anesthesia Type:Bier block  Level of Consciousness: drowsy and patient cooperative  Airway & Oxygen Therapy: Patient Spontanous Breathing and Patient connected to face mask oxygen  Post-op Assessment: Report given to RN and Post -op Vital signs reviewed and stable  Post vital signs: Reviewed and stable  Last Vitals:  Vitals Value Taken Time  BP 154/81 06/06/19 1123  Temp    Pulse 63 06/06/19 1125  Resp 12 06/06/19 1125  SpO2 100 % 06/06/19 1125  Vitals shown include unvalidated device data.  Last Pain:  Vitals:   06/06/19 0954  TempSrc: Temporal  PainSc: 0-No pain         Complications: No apparent anesthesia complications

## 2019-06-06 NOTE — Op Note (Signed)
06/06/2019 [facility]  Operative Note  PREOPERATIVE DIAGNOSIS: RIGHT LONG FINGER TRIGGER DIGIT, ANNULAR LIGAMENT CYST RIGHT LONG FINGER  POSTOPERATIVE DIAGNOSIS:  RIGHT LONG FINGER TRIGGER DIGIT, ANNULAR LIGAMENT   PROCEDURE:  1. RELEASE TRIGGER FINGER/A-1 PULLEY RIGHT LONG FINGER 2. EXCISION CYST RIGHT LONG FINGER  SURGEON:  [name], MD  ASSISTANT:  none.  ANESTHESIA:  Bier block with sedation.  IV FLUIDS:  Per anesthesia flow sheet.  ESTIMATED BLOOD LOSS:  Minimal.  COMPLICATIONS:  None.  SPECIMENS:  Right long finger annular ligament cyst to pathology.  TOURNIQUET TIME:  Total Tourniquet Time Documented: Forearm (Right) - 25 minutes Total: Forearm (Right) - 25 minutes   DISPOSITION:  Stable to PACU.  LOCATION: [facility]  INDICATIONS: Ryan Knapp is a 58 y.o. male with triggering right long finger and mass of finger.  This has been injected without lasting resolution.  He wishes to have a trigger release and excision of the mass.  Risks, benefits and alternatives of surgery were discussed including the risk of blood loss, infection, damage to nerves, vessels, tendons, ligaments, bone, failure of surgery, need for additional surgery, complications with wound healing, continued pain, continued triggering and need for repeat surgery.  He voiced understanding of these risks and elected to proceed.  OPERATIVE COURSE:  After being identified preoperatively by myself, the patient and I agreed upon the procedure and site of procedure.  The surgical site was marked. The risks, benefits, and alternatives of surgery were reviewed and he wished to proceed.  Surgical consent had been signed. He was given IV Ancef as preoperative antibiotic prophylaxis. He was transported to the operating room and placed on the operating room table in supine position with the Right upper extremity on an arm board. Bier block anesthesia was induced by the  anesthesiologist.  The Right upper extremity was prepped and draped in normal sterile orthopedic fashion. A surgical pause was performed between surgeons, anesthesia, and operating room staff, and all were in agreement as to the patient, procedure, and site of procedure.  Tourniquet at the proximal aspect of the forearm had been inflated for the Bier block.  An incision was made at the volar aspect of the MP joint of the long finger.  This was carried into the subcutaneous tissues by preading technique.  Bipolar electrocautery was used to obtain hemostasis.  The radial and ulnar digital nerves were protected throughout the case. The flexor sheath was identified.  The A1 pulley was identified and sharply incised.  It was released in its entirety.  The proximal 1-2 mm of the A2 pulley was vented to allow better excursion of the tendons.  The finger was placed through a range of motion and there was noted to be no catching.  The tendons were brought through the wound and any adherences released.  An additional incision was made over the mass at the level of the proximal finger flexion crease.  This was carried into the subcutaneous tissues by spreading technique.  The mass was identified coming from the A2 pulley.  It was freed of soft tissue attachments.  It was removed along with a window of the A2 pulley.  Flexor tendons were protected.  The wounds were copiously irrigated with sterile saline.  They were closed with 4-0 nylon in a horizontal mattress fashion.  They were injected with 0.25% plain Marcaine to aid in postoperative analgesia.  They were dressed with sterile Xeroform, 4x4s, and wrapped lightly with a Coban  dressing.  Tourniquet was deflated at 25 minutes.  The fingertips were pink with brisk capillary refill after deflation of the tourniquet.  The operative drapes were broken down and the patient was awoken from anesthesia safely.  He was transferred back to the stretcher and taken to the PACU in stable  condition.   I will see him back in the office in 1 week for postoperative followup.  I will give him a prescription for Norco 5/325 1-2 tabs PO q6 hours prn pain, dispense # 20.    [name], MD Electronically signed, 06/06/19

## 2019-06-06 NOTE — Anesthesia Postprocedure Evaluation (Signed)
Anesthesia Post Note  Patient: Ryan Knapp  Procedure(s) Performed: RELEASE TRIGGER FINGER/A-1 PULLEY RIGHT LONG FINGER, EXCISION CYST RIGHT LONG FINGER (Right Hand)     Patient location during evaluation: PACU Anesthesia Type: MAC Level of consciousness: awake and alert and oriented Pain management: pain level controlled Vital Signs Assessment: post-procedure vital signs reviewed and stable Respiratory status: spontaneous breathing, nonlabored ventilation and respiratory function stable Cardiovascular status: stable and blood pressure returned to baseline Postop Assessment: no apparent nausea or vomiting Anesthetic complications: no    Last Vitals:  Vitals:   06/06/19 1123 06/06/19 1124  BP: (!) 154/81   Pulse:  64  Resp:  11  Temp:  37.1 C  SpO2:  100%    Last Pain:  Vitals:   06/06/19 1124  TempSrc:   PainSc: Asleep                 Montray Kliebert A.

## 2019-06-06 NOTE — Discharge Instructions (Addendum)

## 2019-06-06 NOTE — Anesthesia Preprocedure Evaluation (Addendum)
Anesthesia Evaluation  Patient identified by MRN, date of birth, ID band Patient awake    Reviewed: Allergy & Precautions, Patient's Chart, lab work & pertinent test results  Airway Mallampati: II  TM Distance: >3 FB Neck ROM: Full    Dental no notable dental hx. (+) Teeth Intact   Pulmonary former smoker,    Pulmonary exam normal breath sounds clear to auscultation       Cardiovascular negative cardio ROS Normal cardiovascular exam Rhythm:Regular Rate:Normal     Neuro/Psych  Headaches, negative psych ROS   GI/Hepatic Neg liver ROS, hiatal hernia, GERD  Medicated and Controlled,  Endo/Other  Hypothyroidism Dyslipidemia  Renal/GU negative Renal ROS  negative genitourinary   Musculoskeletal Right long finger trigger finger and cyst IP joint   Abdominal   Peds  Hematology negative hematology ROS (+)   Anesthesia Other Findings   Reproductive/Obstetrics                             Anesthesia Physical Anesthesia Plan  ASA: II  Anesthesia Plan: Bier Block and MAC and Bier Block-LIDOCAINE ONLY   Post-op Pain Management:    Induction: Intravenous  PONV Risk Score and Plan: 3 and Midazolam, Ondansetron, Treatment may vary due to age or medical condition and Propofol infusion  Airway Management Planned: Natural Airway, Nasal Cannula and Simple Face Mask  Additional Equipment:   Intra-op Plan:   Post-operative Plan:   Informed Consent: I have reviewed the patients History and Physical, chart, labs and discussed the procedure including the risks, benefits and alternatives for the proposed anesthesia with the patient or authorized representative who has indicated his/her understanding and acceptance.     Dental advisory given  Plan Discussed with: CRNA and Surgeon  Anesthesia Plan Comments:        Anesthesia Quick Evaluation

## 2019-06-07 ENCOUNTER — Encounter: Payer: Self-pay | Admitting: *Deleted

## 2019-06-07 LAB — SURGICAL PATHOLOGY

## 2019-10-16 IMAGING — CT CT ANGIOGRAPHY CHEST
2 of 6 series · 19 of 46 positions shown · IV contrast (omnipaque)
Comparison: Chest x-ray from earlier in the same day.

CLINICAL DATA: Right-sided chest pain and shortness of breath

EXAM:
CT ANGIOGRAPHY CHEST WITH CONTRAST
TECHNIQUE: Multidetector CT imaging of the chest was performed using the
standard protocol during bolus administration of intravenous
contrast. Multiplanar CT image reconstructions and MIPs were
obtained to evaluate the vascular anatomy.
CONTRAST:  75mL OMNIPAQUE IOHEXOL 350 MG/ML SOLN

[Series 7: thins · axial · 0.80mm/px · z∈[+1129,+1382]mm · 16 of 279 slices shown]
[im 13/279  lung]
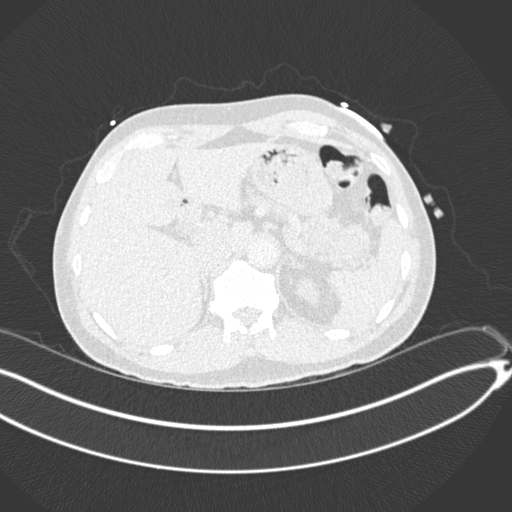
[im 37/279  soft-tissue]
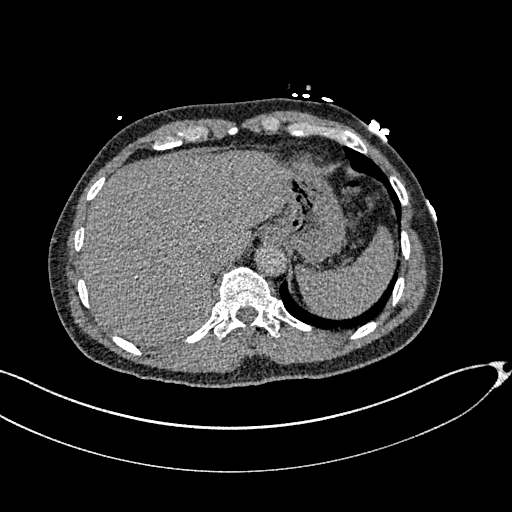
[im 49/279  lung]
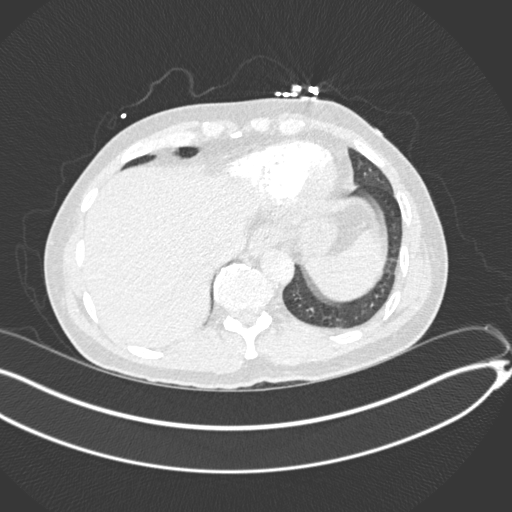
[im 61/279  soft-tissue]
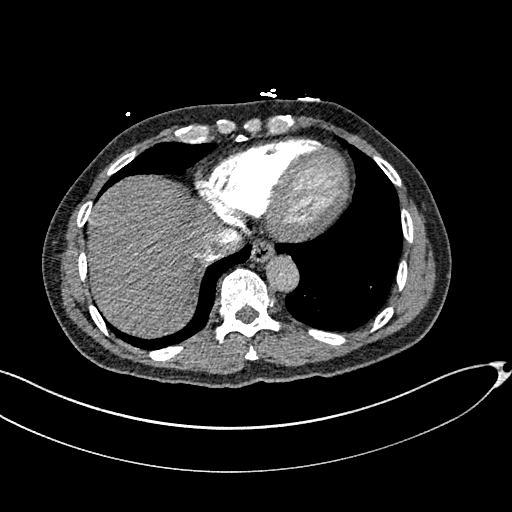
[im 85/279  lung]
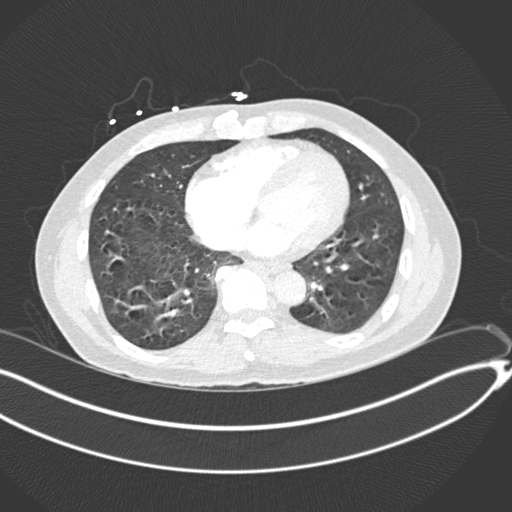
[im 97/279  soft-tissue]
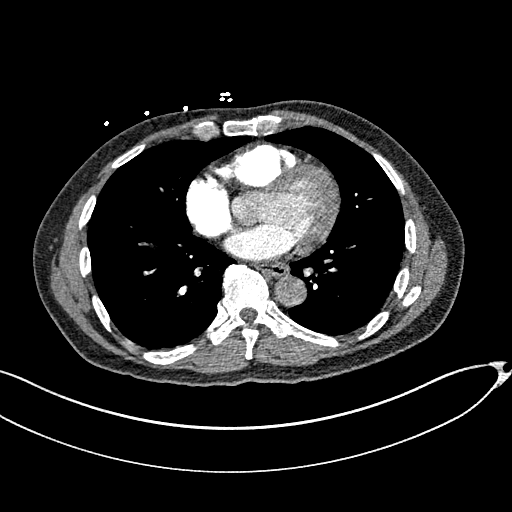
[im 109/279  lung]
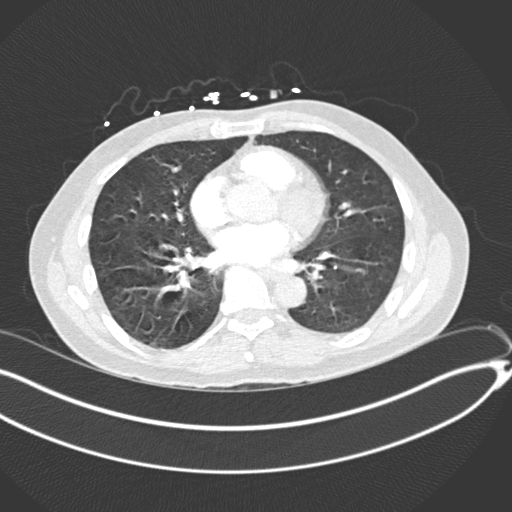
[im 133/279  soft-tissue]
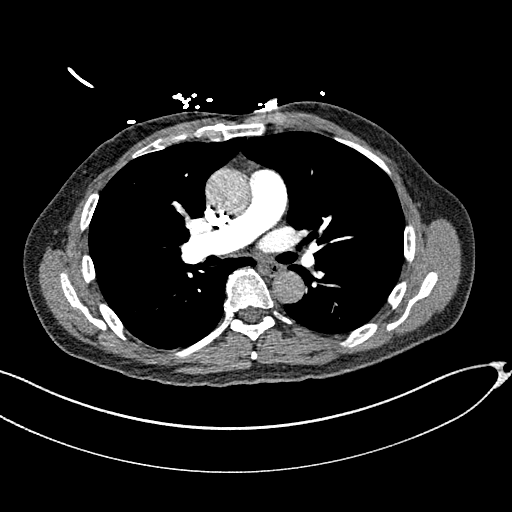
[im 146/279  lung]
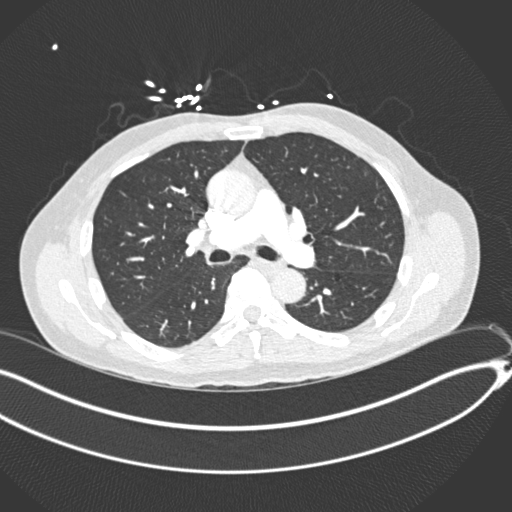
[im 170/279  soft-tissue]
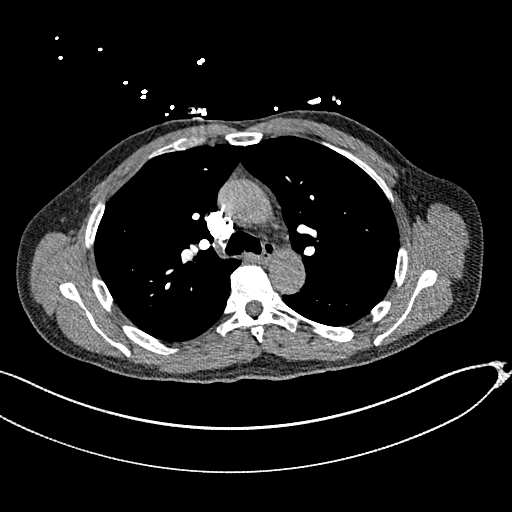
[im 182/279  lung]
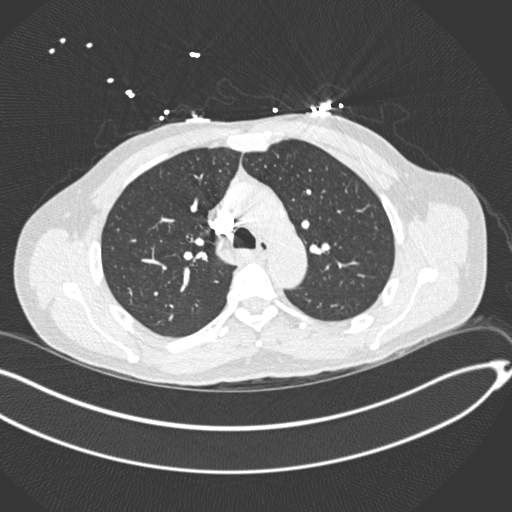
[im 194/279  soft-tissue]
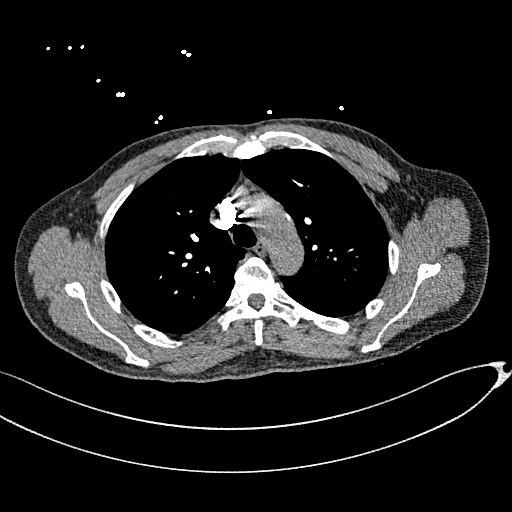
[im 218/279  lung]
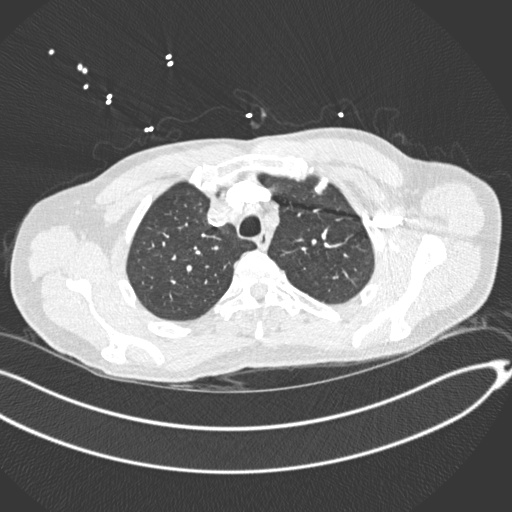
[im 230/279  soft-tissue]
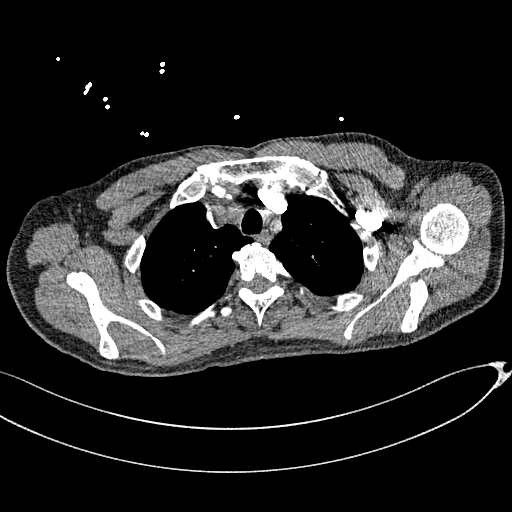
[im 242/279  lung]
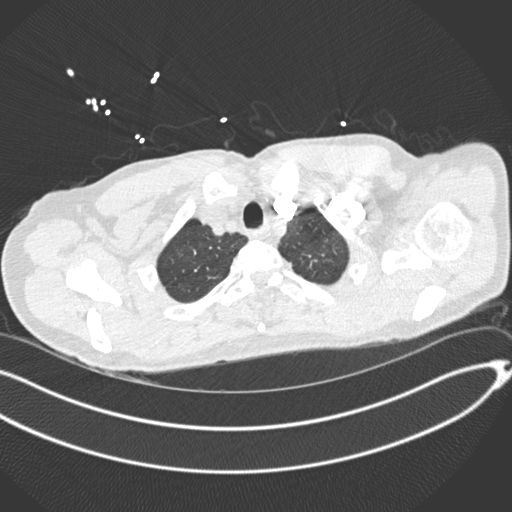
[im 266/279  soft-tissue]
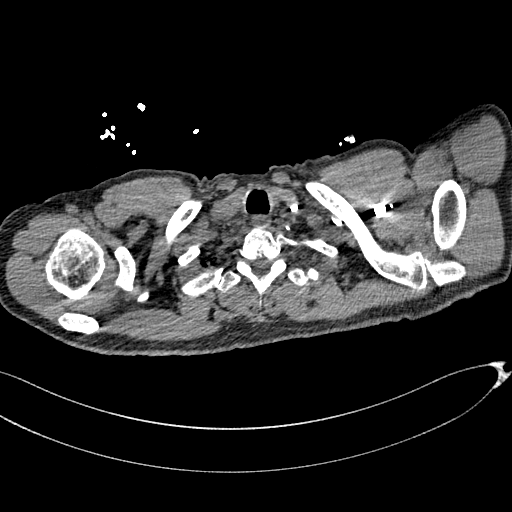

[Series 9: coronal mpr · coronal · 0.56mm/px · 3 of 112 slices shown]
[im 28/112  soft-tissue]
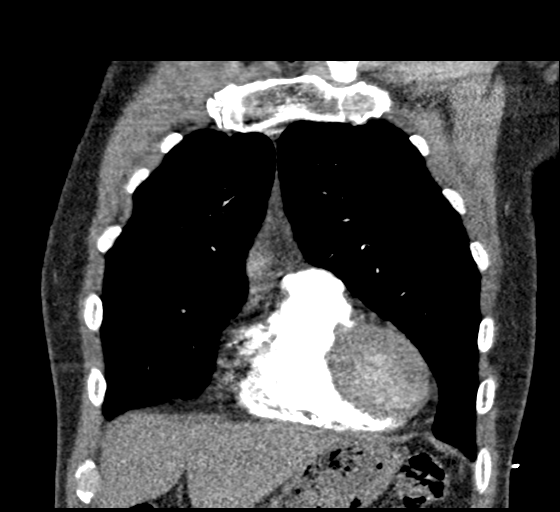
[im 56/112  soft-tissue]
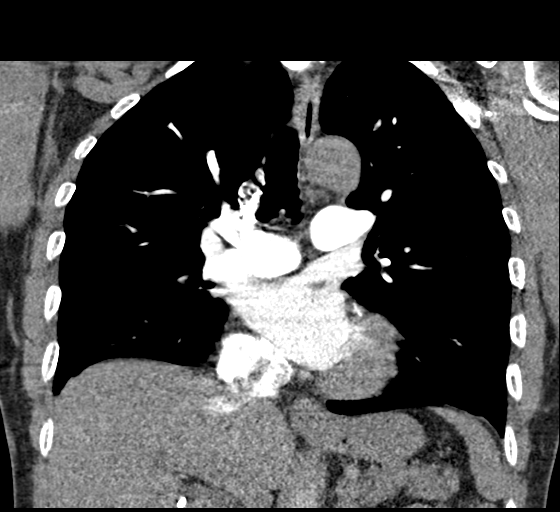
[im 84/112  soft-tissue]
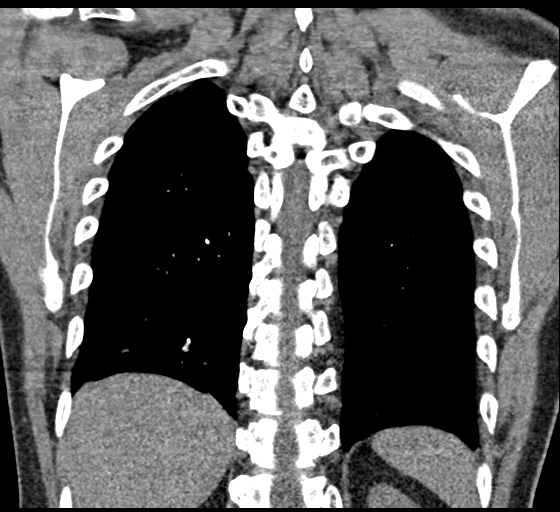

[19 of 46 positions shown; findings below may reference images not displayed]

FINDINGS: Cardiovascular: Thoracic aorta demonstrates atherosclerotic
calcifications without significant enhancement. No aneurysmal
dilatation is seen. Heart is not significantly enlarged in size. The
pulmonary artery shows a normal branching pattern without
intraluminal filling defect to suggest pulmonary embolism.

Mediastinum/Nodes: Thoracic inlet is within normal limits. No hilar
or mediastinal adenopathy is noted. The esophagus is within normal
limits.

Lungs/Pleura: The lungs are well aerated bilaterally. No focal
infiltrate or sizable effusion is seen. No sizable parenchymal
nodules are noted.

Upper Abdomen: Visualized upper abdomen shows changes of prior
cholecystectomy.

Musculoskeletal: Mild degenerative changes of the thoracic spine are
noted.

Review of the MIP images confirms the above findings.
IMPRESSION: No evidence of pulmonary emboli.

No acute abnormality seen.

## 2021-10-05 ENCOUNTER — Other Ambulatory Visit (HOSPITAL_COMMUNITY): Payer: Self-pay | Admitting: Gastroenterology

## 2021-10-05 ENCOUNTER — Ambulatory Visit (HOSPITAL_COMMUNITY)
Admission: RE | Admit: 2021-10-05 | Discharge: 2021-10-05 | Disposition: A | Discharge status: Home / Self Care | Payer: Commercial Managed Care - PPO | Source: Ambulatory Visit | Attending: Gastroenterology | Admitting: Gastroenterology

## 2021-10-05 ENCOUNTER — Other Ambulatory Visit: Payer: Self-pay | Admitting: Gastroenterology

## 2021-10-05 DIAGNOSIS — R1032 Left lower quadrant pain: Secondary | ICD-10-CM | POA: Insufficient documentation

## 2021-10-05 MED ORDER — IOHEXOL 9 MG/ML PO SOLN: 1000.0000 mL | ORAL | Status: AC

## 2021-10-05 MED ORDER — IOHEXOL 300 MG/ML  SOLN
100.0000 mL | Freq: Once | INTRAMUSCULAR | Status: AC | PRN
  Administered 2021-10-05: 100 mL via INTRAVENOUS

## 2022-04-25 ENCOUNTER — Other Ambulatory Visit: Payer: Self-pay

## 2022-04-25 DIAGNOSIS — I83891 Varicose veins of right lower extremities with other complications: Secondary | ICD-10-CM

## 2022-04-27 ENCOUNTER — Encounter: Payer: Self-pay | Admitting: Vascular Surgery

## 2022-04-27 ENCOUNTER — Ambulatory Visit (HOSPITAL_COMMUNITY)
Admission: RE | Admit: 2022-04-27 | Discharge: 2022-04-27 | Disposition: A | Discharge status: Home / Self Care | Payer: Commercial Managed Care - PPO | Source: Ambulatory Visit | Attending: Vascular Surgery | Admitting: Vascular Surgery

## 2022-04-27 ENCOUNTER — Ambulatory Visit (INDEPENDENT_AMBULATORY_CARE_PROVIDER_SITE_OTHER): Payer: Commercial Managed Care - PPO | Admitting: Vascular Surgery

## 2022-04-27 VITALS — BP 130/73 | HR 63 | Temp 98.7°F | Resp 18 | Ht 68.5 in | Wt 156.0 lb

## 2022-04-27 DIAGNOSIS — I872 Venous insufficiency (chronic) (peripheral): Secondary | ICD-10-CM

## 2022-04-27 DIAGNOSIS — I83891 Varicose veins of right lower extremities with other complications: Secondary | ICD-10-CM | POA: Diagnosis not present

## 2022-04-27 NOTE — Progress Notes (Signed)
ASSESSMENT & PLAN   CHRONIC VENOUS INSUFFICIENCY: This patient has deep venous reflux only on his duplex scan today.  I think his symptoms are from venous hypertension.  We have discussed the importance of intermittent leg elevation and the proper positioning for this.  In addition I encouraged him to continue to wear his thigh-high compression stockings and be sure to change them about every 6 months.  I have encouraged him to avoid prolonged sitting and standing.  We discussed importance of exercise specifically walking and water aerobics.  If his symptoms progress certainly we could reevaluate in the future.  However currently he has undergone successful ablation of the right great saphenous vein and has no other evidence of significant superficial venous reflux.  His reflux is limited to the deep system only.  REASON FOR CONSULT:    Right lower extremity pain x1 year.  HPI:   Ryan Knapp is a 61 y.o. male who has previously undergone laser ablation of the right great saphenous vein November 2018.  The right great saphenous vein was ablated from the saphenofemoral junction to the proximal calf.  He presents now with recurrent pain in the right leg which she has had for about a year.  On my history, the patient describes aching pain and heaviness in the right leg which is aggravated by standing and relieved when he wears his thigh-high compression stocking.  He does elevate his legs some but sometimes this makes the pain worse.  He denies any history of claudication, rest pain, or nonhealing ulcers.  He does have a history of back pain.  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  However this pain has been stable.  He describes some pain that radiates down the back of his leg but states that the aching pain he experiences in the right leg is different.  His risk factors for peripheral arterial disease include hyperlipidemia and a remote history of tobacco use.  He denies any  history of diabetes, hypertension, or family history of premature cardiovascular disease.  Past Medical History:  Diagnosis Date   Allergic rhinitis    Cancer ([place])    Chest pain    Dyslipidemia    Epistaxis    req cauterization in April 2009   GERD (gastroesophageal reflux disease)    Hemorrhoids    Hepatic hemangioma    Hiatal hernia    Hx of thyroid cancer    Radioactive iodine treatment   Hypothyroidism    Tension headache     Family History  Problem Relation Age of Onset   Stroke Mother    Heart disease Mother    COPD Father    Stroke Sister     SOCIAL HISTORY: Social History   Tobacco Use   Smoking status: Former    Types: Cigarettes    Quit date: 07/26/1984    Years since quitting: 37.7   Smokeless tobacco: Never   Tobacco comments:    quit 1986  Substance Use Topics   Alcohol use: Yes    Comment: occasional red wine    No Known Allergies  Current Outpatient Medications  Medication Sig Dispense Refill   Ascorbic Acid (VITAMIN C) 500 MG CHEW Chew 500 mg by mouth daily as needed (immune system boost).     levothyroxine (SYNTHROID) 137 MCG tablet Take 125 mcg by mouth daily before breakfast.     HYDROcodone-acetaminophen (NORCO) 5-325 MG tablet 1-2 tabs po q6 hours prn pain 20 tablet  0   omeprazole (PRILOSEC) 20 MG capsule Take 20 mg by mouth daily.     No current facility-administered medications for this visit.    REVIEW OF SYSTEMS:  '[X]'$  denotes positive finding, '[ ]'$  denotes negative finding Cardiac  Comments:  Chest pain or chest pressure: x   Shortness of breath upon exertion: x   Short of breath when lying flat: x   Irregular heart rhythm: x       Vascular    Pain in calf, thigh, or hip brought on by ambulation: x   Pain in feet at night that wakes you up from your sleep:     Blood clot in your veins:    Leg swelling:         Pulmonary    Oxygen at home:    Productive cough:     Wheezing:         Neurologic    Sudden weakness in arms  or legs:     Sudden numbness in arms or legs:     Sudden onset of difficulty speaking or slurred speech:    Temporary loss of vision in one eye:     Problems with dizziness:         Gastrointestinal    Blood in stool:     Vomited blood:         Genitourinary    Burning when urinating:     Blood in urine:        Psychiatric    Major depression:         Hematologic    Bleeding problems:    Problems with blood clotting too easily:        Skin    Rashes or ulcers:        Constitutional    Fever or chills:    -  PHYSICAL EXAM:   Vitals:   04/27/22 0834  BP: 130/73  Pulse: 63  Resp: 18  Temp: 98.7 F (37.1 C)  TempSrc: Temporal  SpO2: 99%  Weight: 156 lb (70.8 kg)  Height: 5' 8.5" (1.74 m)   Body mass index is 23.37 kg/m. GENERAL: The patient is a well-nourished male, in no acute distress. The vital signs are documented above. CARDIAC: There is a regular rate and rhythm.  VASCULAR: I do not detect carotid bruits. He has palpable femoral, popliteal, dorsalis pedis, and posterior tibial pulses bilaterally. He has no significant hyperpigmentation or significant swelling. PULMONARY: There is good air exchange bilaterally without wheezing or rales. ABDOMEN: Soft and non-tender with normal pitched bowel sounds.  MUSCULOSKELETAL: There are no major deformities. NEUROLOGIC: No focal weakness or paresthesias are detected. SKIN: There are no ulcers or rashes noted. PSYCHIATRIC: The patient has a normal affect.  DATA:    VENOUS DUPLEX: I have independently interpreted his venous duplex scan.  This was of the right lower extremity only today.  This showed no evidence of DVT.  There was deep venous reflux in the popliteal vein.  There was no significant superficial venous reflux.  The anterior accessory saphenous vein was competent.  The small saphenous vein was competent.  The right great saphenous vein has been previously ablated.  The results are summarized on the diagram  below.     Deitra Mayo [facility]

## 2022-05-24 ENCOUNTER — Other Ambulatory Visit: Payer: Self-pay | Admitting: Otolaryngology

## 2022-05-24 DIAGNOSIS — H903 Sensorineural hearing loss, bilateral: Secondary | ICD-10-CM

## 2022-05-31 ENCOUNTER — Emergency Department (HOSPITAL_COMMUNITY)
Admission: EM | Admit: 2022-05-31 | Discharge: 2022-06-01 | Discharge status: Against Medical Advice | Payer: Commercial Managed Care - PPO | Attending: Emergency Medicine | Admitting: Emergency Medicine

## 2022-05-31 ENCOUNTER — Encounter (HOSPITAL_COMMUNITY): Payer: Self-pay

## 2022-05-31 DIAGNOSIS — I10 Essential (primary) hypertension: Secondary | ICD-10-CM | POA: Insufficient documentation

## 2022-05-31 DIAGNOSIS — Z5321 Procedure and treatment not carried out due to patient leaving prior to being seen by health care provider: Secondary | ICD-10-CM | POA: Diagnosis not present

## 2022-05-31 DIAGNOSIS — R42 Dizziness and giddiness: Secondary | ICD-10-CM | POA: Diagnosis present

## 2022-05-31 LAB — CBC WITH DIFFERENTIAL/PLATELET
Abs Immature Granulocytes: 0.04 10*3/uL (ref 0.00–0.07)
Basophils Absolute: 0 10*3/uL (ref 0.0–0.1)
Basophils Relative: 0 %
Eosinophils Absolute: 0.1 10*3/uL (ref 0.0–0.5)
Eosinophils Relative: 1 %
HCT: 41.5 % (ref 39.0–52.0)
Hemoglobin: 14.3 g/dL (ref 13.0–17.0)
Immature Granulocytes: 0 %
Lymphocytes Relative: 21 %
Lymphs Abs: 2 10*3/uL (ref 0.7–4.0)
MCH: 30.8 pg (ref 26.0–34.0)
MCHC: 34.5 g/dL (ref 30.0–36.0)
MCV: 89.2 fL (ref 80.0–100.0)
Monocytes Absolute: 0.7 10*3/uL (ref 0.1–1.0)
Monocytes Relative: 8 %
Neutro Abs: 6.5 10*3/uL (ref 1.7–7.7)
Neutrophils Relative %: 70 %
Platelets: 192 10*3/uL (ref 150–400)
RBC: 4.65 MIL/uL (ref 4.22–5.81)
RDW: 11.7 % (ref 11.5–15.5)
WBC: 9.3 10*3/uL (ref 4.0–10.5)
nRBC: 0 % (ref 0.0–0.2)

## 2022-05-31 LAB — BASIC METABOLIC PANEL
Anion gap: 8 (ref 5–15)
BUN: 14 mg/dL (ref 8–23)
CO2: 27 mmol/L (ref 22–32)
Calcium: 9 mg/dL (ref 8.9–10.3)
Chloride: 100 mmol/L (ref 98–111)
Creatinine, Ser: 0.75 mg/dL (ref 0.61–1.24)
GFR, Estimated: >60 mL/min (ref >60)
Glucose, Bld: 137 mg/dL — ABNORMAL HIGH (ref 70–99)
Potassium: 3.8 mmol/L (ref 3.5–5.1)
Sodium: 135 mmol/L (ref 135–145)

## 2022-05-31 LAB — TROPONIN I (HIGH SENSITIVITY): Troponin I (High Sensitivity): 4 ng/L (ref <18)

## 2022-05-31 NOTE — ED Triage Notes (Signed)
Pt arrived complaining of HTN and vision changed while at work today. Pt states he is feeling better now but wanted to make sure that his blood pressure was improving.   States that he also felt a little bit dizzy at work but did not pass out or fall

## 2022-05-31 NOTE — ED Provider Triage Note (Signed)
Emergency Medicine Provider Triage Evaluation Note  Ryan Knapp , a 61 y.o. male  was evaluated in triage.  Pt complains of dizziness and elevated blood pressure.  Symptoms started while he was at the [employer] felt lightheaded and dizzy but not spinning.  Blood pressure 160/100 at home he became concerned.  Does not take blood pressure medication.  Denies significant headache, chest pain, shortness of breath, visual loss.  Does have some blurry vision.  Denies room spinning dizziness..  Review of Systems  Positive: Elevated blood pressure, dizzy Negative: Focal neurological deficit, chest pain, shortness of breat  Physical Exam  BP (!) 151/91 (BP Location: Right Arm)   Pulse 61   Temp 97.6 F (36.4 C) (Oral)   Resp 16   SpO2 99%  Gen:   Awake, no distress   Resp:  Normal effort  MSK:   Moves extremities without difficulty  Other:  5/5 strength throughout, no ataxia on finger-to-nose, no nystagmus  Medical Decision Making  Medically screening exam initiated at 7:23 PM.  Appropriate orders placed.  PATRICK SALEMI was informed that the remainder of the evaluation will be completed by another provider, this initial triage assessment does not replace that evaluation, and the importance of remaining in the ED until their evaluation is complete.  Elevated blood pressure   Ezequiel Essex, MD 05/31/22 1924

## 2022-06-01 NOTE — ED Notes (Signed)
X2 vitals recheck with no response

## 2022-06-13 ENCOUNTER — Ambulatory Visit
Admission: RE | Admit: 2022-06-13 | Discharge: 2022-06-13 | Disposition: A | Discharge status: Home / Self Care | Payer: Commercial Managed Care - PPO | Source: Ambulatory Visit | Attending: Otolaryngology | Admitting: Otolaryngology

## 2022-06-13 DIAGNOSIS — H903 Sensorineural hearing loss, bilateral: Secondary | ICD-10-CM

## 2022-06-13 MED ORDER — GADOPICLENOL 0.5 MMOL/ML IV SOLN
7.5000 mL | Freq: Once | INTRAVENOUS | Status: AC | PRN
  Administered 2022-06-13: 7.5 mL via INTRAVENOUS

## 2022-06-19 IMAGING — CT CT ABD-PELV W/ CM
2 of 5 series · 15 of 46 positions shown, 17 images · IV contrast (agent unspecified)
Comparison: CT report 07/19/2022

CLINICAL DATA: Right-sided abdominal pain

EXAM:
CT ABDOMEN AND PELVIS WITH CONTRAST
TECHNIQUE: Multidetector CT imaging of the abdomen and pelvis was performed
using the standard protocol following bolus administration of
intravenous contrast.

[Series 2: axial st · axial · 0.74mm/px · z∈[-509,-114]mm · 12 of 95 slices shown, 14 images]
[im 8/95  soft-tissue]
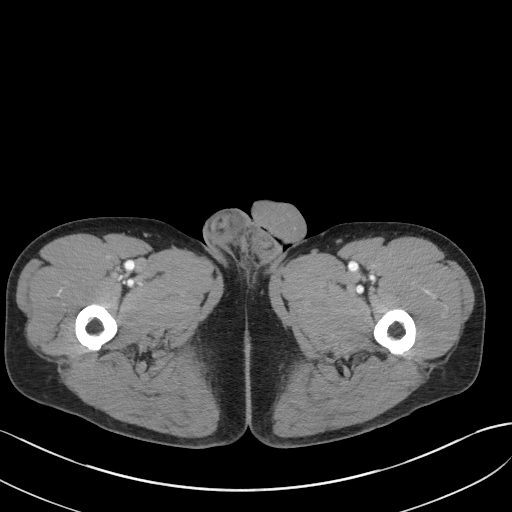
[im 8/95  bone]
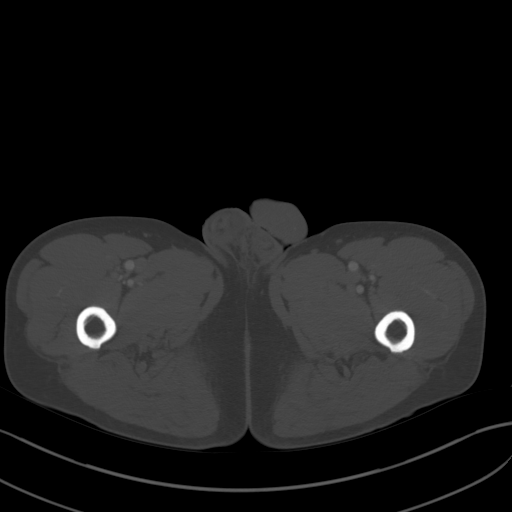
[im 15/95  soft-tissue]
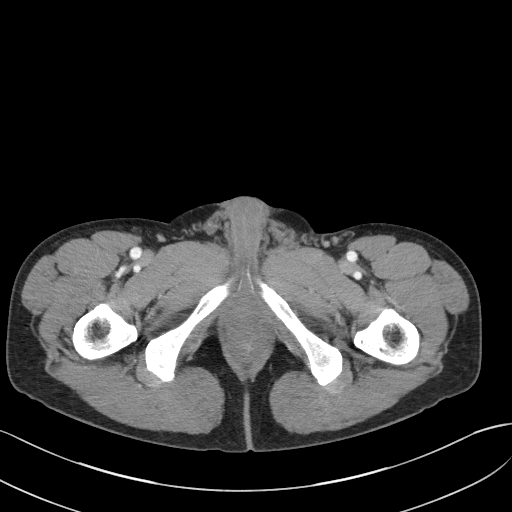
[im 22/95  soft-tissue]
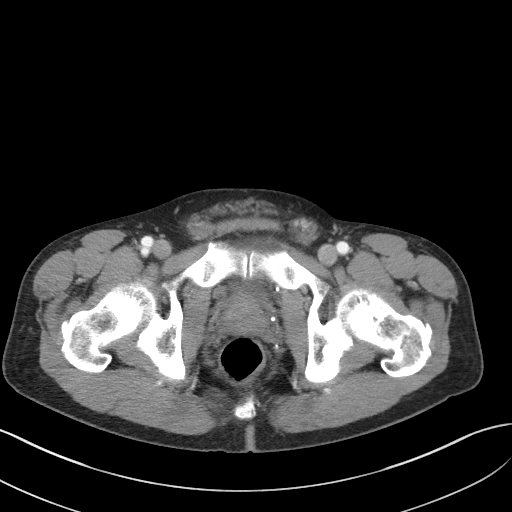
[im 29/95  soft-tissue]
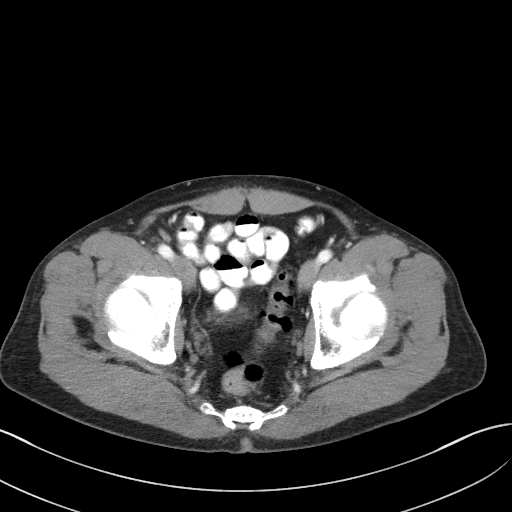
[im 37/95  soft-tissue]
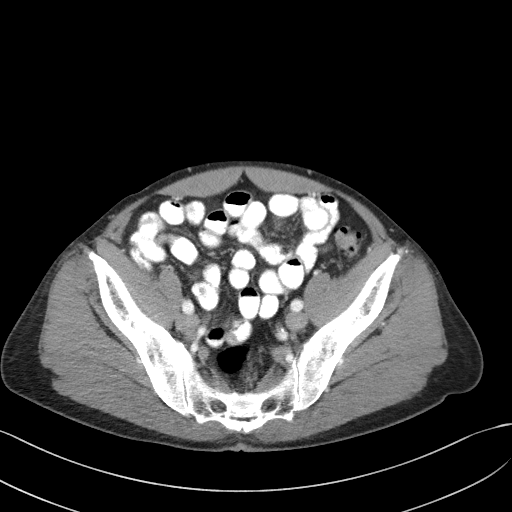
[im 44/95  soft-tissue]
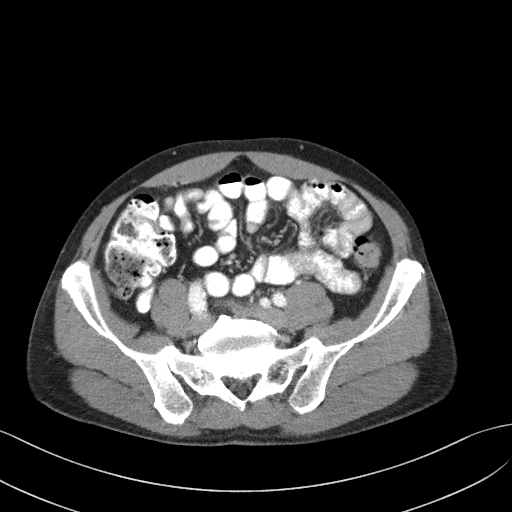
[im 51/95  soft-tissue]
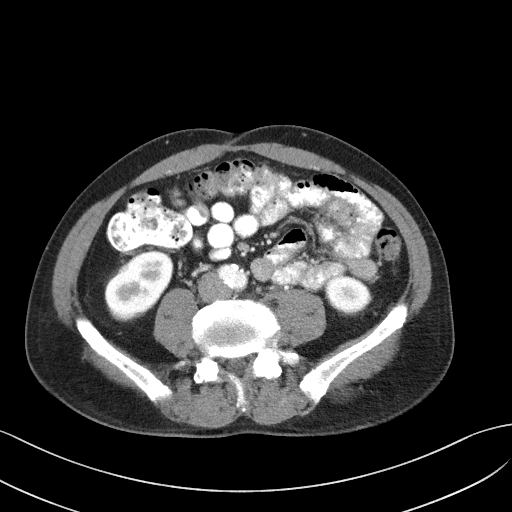
[im 58/95  soft-tissue]
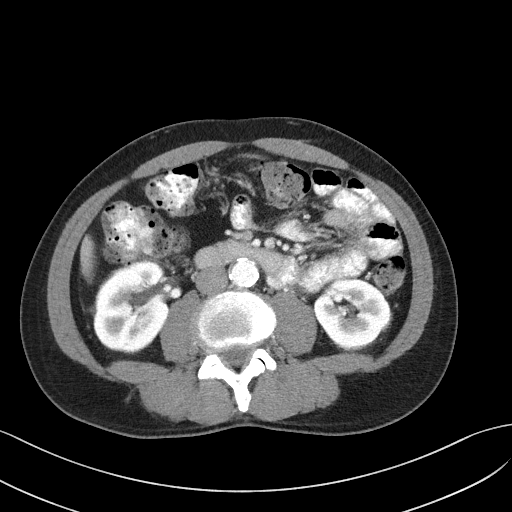
[im 66/95  soft-tissue]
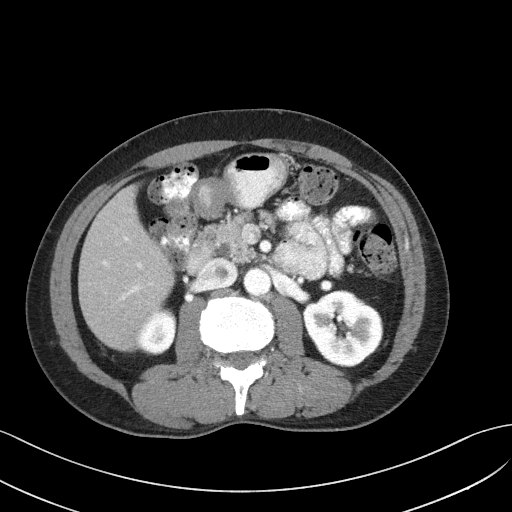
[im 66/95  bone]
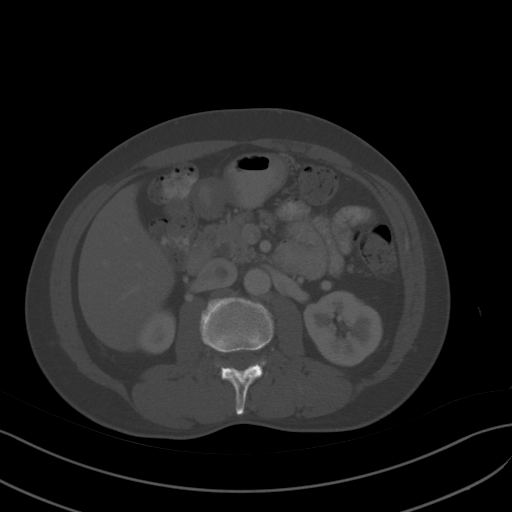
[im 73/95  soft-tissue]
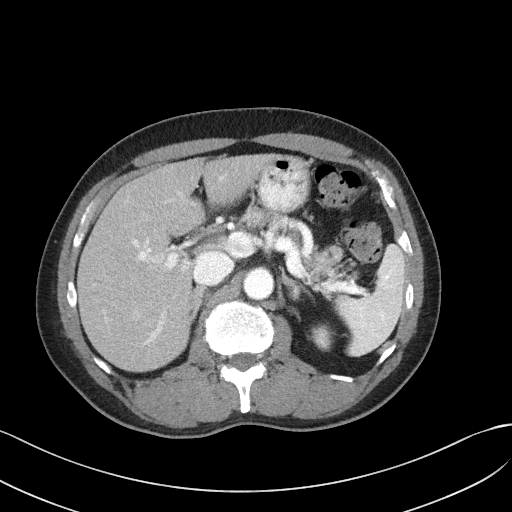
[im 80/95  soft-tissue]
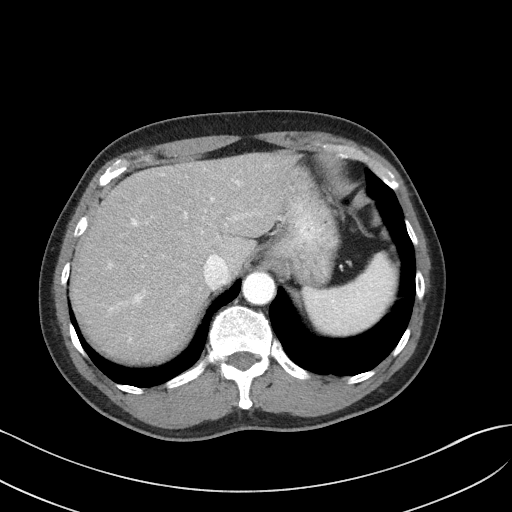
[im 87/95  soft-tissue]
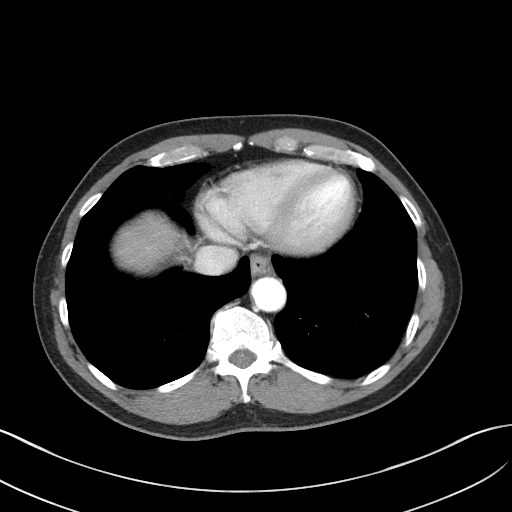

[Series 4: coronal st · coronal · 0.68mm/px · 3 of 82 slices shown]
[im 28/82  soft-tissue]
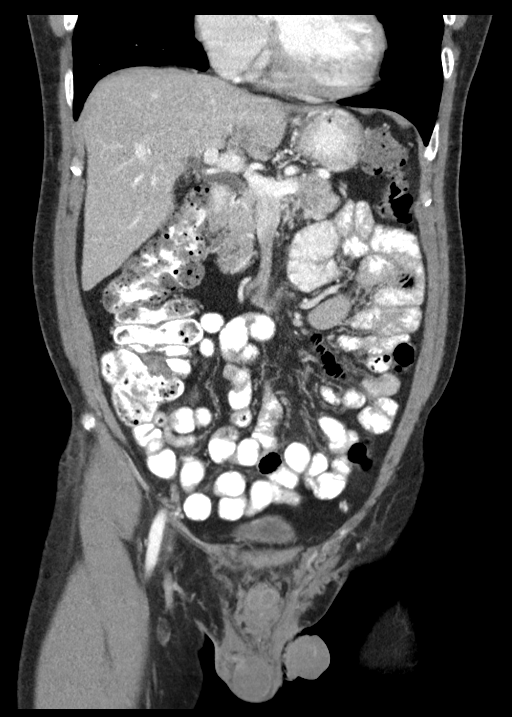
[im 37/82  soft-tissue]
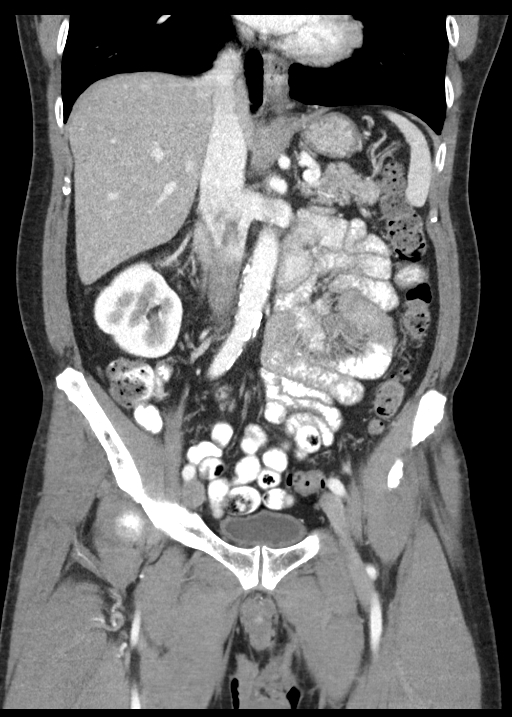
[im 46/82  soft-tissue]
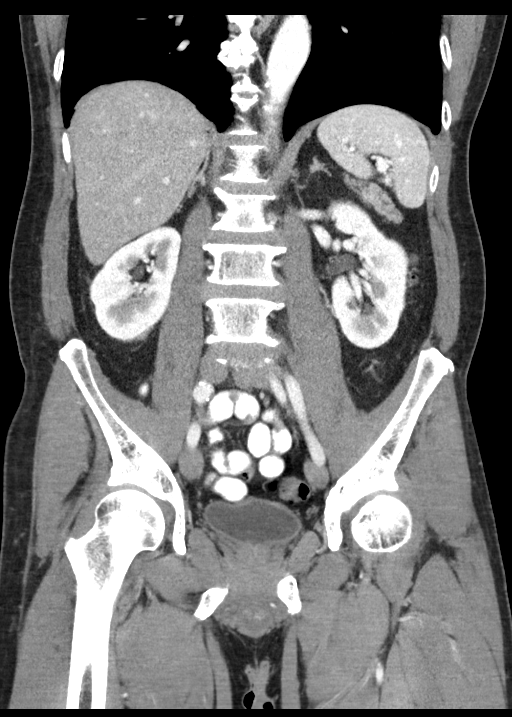

[15 of 46 positions shown; findings below may reference images not displayed]

RADIATION DOSE REDUCTION: This exam was performed according to the
departmental dose-optimization program which includes automated
exposure control, adjustment of the mA and/or kV according to
patient size and/or use of iterative reconstruction technique.

CONTRAST:  100mL OMNIPAQUE IOHEXOL 300 MG/ML  SOLN
FINDINGS: Lower chest: Lung bases demonstrate no acute consolidation or
effusion. Normal cardiac size.

Hepatobiliary: Probable focal fat infiltration near the falciform
ligament. Status post cholecystectomy. No biliary dilatation.
Described right hepatic lobe lesion on prior report not definitively
identified today

Pancreas: Unremarkable. No pancreatic ductal dilatation or
surrounding inflammatory changes.

Spleen: Normal in size without focal abnormality.

Adrenals/Urinary Tract: No dominant adrenal nodule. Kidneys show no
hydronephrosis. 17 mm cyst midpole left kidney, no follow-up imaging
recommended. Bladder is unremarkable

Stomach/Bowel: Stomach is within normal limits. Appendix appears
normal. No evidence of bowel wall thickening, distention, or
inflammatory changes. Mild diverticular disease of the sigmoid
colon.

Vascular/Lymphatic: Mild atherosclerosis of the aorta. No aneurysm.
No suspicious lymph nodes

Reproductive: Prostate is unremarkable.

Other: Negative for pelvic effusion or free air.

Musculoskeletal: No acute or significant osseous findings.
IMPRESSION: 1. No CT evidence for acute intra-abdominal or pelvic abnormality.
Negative for appendicitis.
2. Mild diverticular disease of the colon without acute inflammatory
process.

## 2022-12-06 ENCOUNTER — Other Ambulatory Visit: Payer: Self-pay | Admitting: Internal Medicine

## 2022-12-06 ENCOUNTER — Ambulatory Visit
Admission: RE | Admit: 2022-12-06 | Discharge: 2022-12-06 | Disposition: A | Discharge status: Home / Self Care | Payer: Commercial Managed Care - PPO | Source: Ambulatory Visit | Attending: Internal Medicine | Admitting: Internal Medicine

## 2022-12-06 DIAGNOSIS — R0602 Shortness of breath: Secondary | ICD-10-CM

## 2023-05-15 ENCOUNTER — Telehealth (INDEPENDENT_AMBULATORY_CARE_PROVIDER_SITE_OTHER): Payer: Self-pay | Admitting: Otolaryngology

## 2023-05-15 NOTE — Telephone Encounter (Signed)
We received a referral for patient stating pain in ears. Patient is already a patient of Dr Luther Hearing and saw him last on 05/16/22 per Dr Luther Hearing old system so no referral is required and can be scheduled as a follow up. I called and lvm for patient to call back and schedule.

## 2023-05-23 ENCOUNTER — Ambulatory Visit (INDEPENDENT_AMBULATORY_CARE_PROVIDER_SITE_OTHER): Payer: Commercial Managed Care - PPO | Admitting: Otolaryngology

## 2023-05-23 ENCOUNTER — Encounter (INDEPENDENT_AMBULATORY_CARE_PROVIDER_SITE_OTHER): Payer: Self-pay

## 2023-05-23 VITALS — Ht 69.0 in | Wt 160.0 lb

## 2023-05-23 DIAGNOSIS — H9203 Otalgia, bilateral: Secondary | ICD-10-CM

## 2023-05-23 DIAGNOSIS — H6121 Impacted cerumen, right ear: Secondary | ICD-10-CM

## 2023-05-24 DIAGNOSIS — H6121 Impacted cerumen, right ear: Secondary | ICD-10-CM | POA: Insufficient documentation

## 2023-05-24 DIAGNOSIS — H9203 Otalgia, bilateral: Secondary | ICD-10-CM | POA: Insufficient documentation

## 2023-05-24 NOTE — Progress Notes (Addendum)
Patient ID: Ryan Knapp, male   DOB: 12-09-1960, 62 y.o.   MRN: [id number]  CC: Bilateral ear pain  HPI: The patient is a 62 year old male who presents today complaining of bilateral ear pain for the past several months.  The patient was last seen 1 year ago.  At that time, he was complaining of left ear pain since April 2023.  He was previously evaluated by Dr. Christia Reading at [facility].  No significant abnormality was noted.  At his last visit, he was found to have left TMJ tenderness.  He was also noted to have asymmetric left ear low-frequency sensorineural hearing loss.  His MRI scan was negative for retrocochlear lesion.  The patient returns today complaining of bilateral otalgia.  He denies any otorrhea, vertigo, or change in his hearing.  Exam: General: Communicates without difficulty, well nourished, no acute distress. Head: Normocephalic, no evidence injury, no tenderness, facial buttresses intact without stepoff. Face/sinus: No tenderness to palpation and percussion. Facial movement is normal and symmetric. Eyes: PERRL, EOMI. No scleral icterus, conjunctivae clear. Neuro: CN II exam reveals vision grossly intact.  No nystagmus at any point of gaze. Ears: Auricles well formed without lesions. EAC: Right ear cerumen impaction.  Under the operating microscope, the cerumen is carefully removed with a combination of cerumen currette, alligator forceps, and suction catheters.  After the cerumen is removed, the TMs are noted to be normal.    Both TMJ areas are mildly tender to touch. Nose: External evaluation reveals normal support and skin without lesions.  Dorsum is intact.  Anterior rhinoscopy reveals pink mucosa over anterior aspect of inferior turbinates and intact septum.  No purulence noted. Oral:  Oral cavity and oropharynx are intact, symmetric, without erythema or edema.  Mucosa is moist without lesions. Neck: Full range of motion without pain.  There is no significant lymphadenopathy.  No  masses palpable.  Thyroid bed within normal limits to palpation.  Parotid glands and submandibular glands equal bilaterally without mass.  Trachea is midline. Neuro:  CN 2-12 grossly intact. Gait normal.   Assessment: 1.  Bilateral referred otalgia, possibly a result of musculoskeletal causes.  His TMJ areas are tender to palpation. 2.  Right ear cerumen impaction.  After the cerumen removal procedure, his ear canals, tympanic membranes, and middle ear spaces are normal.  No middle ear effusion or acute infection is noted today.  Plan: 1.  Otomicroscopy with right ear cerumen removal. 2.  The physical exam findings are reviewed with the patient. 3.  The patient is reassured that no acute infection is noted today. 4.  Continue NSAIDs as needed to treat his referred otalgia. 5.  Refer to Dr. Irene Limbo for treatment of his possible TMJ disorder.

## 2023-05-24 NOTE — Addendum Note (Signed)
Addended bySuszanne Conners, Reina Wilton on: 05/24/2023 10:30 AM   Modules accepted: Level of Service

## 2023-12-20 ENCOUNTER — Ambulatory Visit (INDEPENDENT_AMBULATORY_CARE_PROVIDER_SITE_OTHER): Admitting: Podiatry

## 2023-12-20 ENCOUNTER — Ambulatory Visit (INDEPENDENT_AMBULATORY_CARE_PROVIDER_SITE_OTHER)

## 2023-12-20 ENCOUNTER — Encounter: Payer: Self-pay | Admitting: Podiatry

## 2023-12-20 DIAGNOSIS — M7731 Calcaneal spur, right foot: Secondary | ICD-10-CM

## 2023-12-20 DIAGNOSIS — M722 Plantar fascial fibromatosis: Secondary | ICD-10-CM | POA: Diagnosis not present

## 2023-12-20 MED ORDER — TRIAMCINOLONE ACETONIDE 10 MG/ML IJ SUSP
10.0000 mg | Freq: Once | INTRAMUSCULAR | Status: AC
  Administered 2023-12-20: 10 mg

## 2023-12-20 NOTE — Patient Instructions (Signed)

## 2023-12-20 NOTE — Progress Notes (Signed)
 Patient presents complaining of pain on the plantar aspect of the foot along the plantar fascial and also the posterior aspect of the heel around the posterior heel and Achilles tendon insertion right..   Physical exam:  General appearance: Pleasant, and in no acute distress. AOx3.  Vascular: Pedal pulses: DP 2/4 bilaterally, PT 2/4 bilaterally.  No edema lower legs bilaterally. Capillary fill time immediate.  Neurological: Light touch intact feet bilaterally.  Normal Achilles reflex bilaterally.  Negative Tinel's sign tarsal tunnel and porta pedis bilaterally  Dermatologic:   Skin normal temperature bilaterally.  Skin normal color, tone, and texture bilaterally.   Musculoskeletal: Tenderness of the plantar medial aspect of the heel at the plantar calcaneal tubercle right.  Some tenderness at the posterior aspect of the heel at the insertion of the Achilles tendon.  No defects in Achilles tendon noted.  Radiographs: 3 views right foot: Osteophytic changes plantar and posterior aspect calcaneus.  No erosive changes or evidence of stress fractures.  Normal Kagar's's triangle.  Thickening of the plantar fascia  Diagnosis: 1.  Plantar fasciitis right 2.  Calcaneal spur right  Plan: -Gave written and oral exercises and at home PT he can do for the plantar fasciitis and Achilles tendinitis. -Discussed proper shoes to wear. -injected 3cc 2:1 mixture 0.5 cc Marcaine :Kenolog 10mg /52ml at plantar medial origin plantar calcaneal tubercle of plantar fascia.    Return 2 weeks follow-up injection plantar fascia right

## 2024-01-03 ENCOUNTER — Ambulatory Visit: Admitting: Podiatry

## 2024-04-15 ENCOUNTER — Encounter (INDEPENDENT_AMBULATORY_CARE_PROVIDER_SITE_OTHER): Payer: Self-pay | Admitting: Otolaryngology

## 2024-04-15 ENCOUNTER — Ambulatory Visit (INDEPENDENT_AMBULATORY_CARE_PROVIDER_SITE_OTHER): Admitting: Otolaryngology

## 2024-04-15 VITALS — BP 128/77 | HR 68 | Temp 97.7°F | Ht 69.0 in | Wt 162.0 lb

## 2024-04-15 DIAGNOSIS — H9042 Sensorineural hearing loss, unilateral, left ear, with unrestricted hearing on the contralateral side: Secondary | ICD-10-CM | POA: Diagnosis not present

## 2024-04-15 DIAGNOSIS — H9203 Otalgia, bilateral: Secondary | ICD-10-CM | POA: Diagnosis not present

## 2024-04-15 DIAGNOSIS — H6121 Impacted cerumen, right ear: Secondary | ICD-10-CM | POA: Diagnosis not present

## 2024-04-16 DIAGNOSIS — H9042 Sensorineural hearing loss, unilateral, left ear, with unrestricted hearing on the contralateral side: Secondary | ICD-10-CM | POA: Insufficient documentation

## 2024-04-16 NOTE — Progress Notes (Signed)
 Patient ID: Ryan Knapp, male   DOB: [birth year], 63 y.o.   MRN: [id number]  Follow-up: Bilateral ear pain, left ear hearing loss  HPI: The patient is a 63 year old male who returns today for his follow-up evaluation.  The patient has a history of bilateral referred otalgia and left ear low-frequency sensorineural hearing loss.  His previous MRI scan was negative for retrocochlear lesion.  At his last visit in November 2024, he was diagnosed with bilateral TMJ disorder.  He was referred to a TMJ specialist.  The patient was treated with an oral appliance.  The patient returns today complaining of intermittent bilateral ear pain.  He denies any recent change in his hearing.  He was recently diagnosed with obstructive sleep apnea, and was fitted with a CPAP machine.  Currently he denies any otorrhea, vertigo, or change in his hearing.  Exam: General: Communicates without difficulty, well nourished, no acute distress. Head: Normocephalic, no evidence injury, no tenderness, facial buttresses intact without stepoff. Face/sinus: No tenderness to palpation and percussion. Facial movement is normal and symmetric. Eyes: PERRL, EOMI. No scleral icterus, conjunctivae clear. Neuro: CN II exam reveals vision grossly intact.  No nystagmus at any point of gaze. Ears: Auricles well formed without lesions.  Right ear cerumen impaction.  The left ear canal and tympanic membrane are normal.  Nose: External evaluation reveals normal support and skin without lesions.  Dorsum is intact.  Anterior rhinoscopy reveals normal mucosa over anterior aspect of inferior turbinates and intact septum.  No purulence noted. Oral:  Oral cavity and oropharynx are intact, symmetric, without erythema or edema.  Mucosa is moist without lesions. Neck: Full range of motion without pain.  There is no significant lymphadenopathy.  No masses palpable.  Thyroid bed within normal limits to palpation.  Parotid glands and submandibular glands equal  bilaterally without mass.  Trachea is midline. Neuro:  CN 2-12 grossly intact.   Procedure: Right ear cerumen disimpaction Anesthesia: None Description: Under the operating microscope, the cerumen is carefully removed with a combination of cerumen currette, alligator forceps, and suction catheters.  After the cerumen is removed, the TMs are noted to be normal.  No mass, erythema, or lesions. The patient tolerated the procedure well.   Assessment: 1.  Bilateral referred otalgia, secondary to his TMJ disorder.  He was treated with oral appliance. 2.  Right ear cerumen impaction.  After the disimpaction procedure, both tympanic membranes and middle ear spaces are noted to be normal. 3.  Subjectively stable left ear low-frequency sensorineural hearing loss.  His previous MRI scan was negative for retrocochlear lesion.  Plan: 1.  Otomicroscopy with right ear cerumen disimpaction. 2.  The physical exam findings are reviewed with the patient. 3.  NSAID as needed to treat his referred otalgia.  Continue the use of his oral appliance. 4.  The patient will return for reevaluation in 1 year, sooner if needed.

## 2024-07-25 ENCOUNTER — Ambulatory Visit
Admission: RE | Admit: 2024-07-25 | Discharge: 2024-07-25 | Disposition: A | Discharge status: Home / Self Care | Source: Ambulatory Visit | Attending: Internal Medicine | Admitting: Internal Medicine

## 2024-07-25 ENCOUNTER — Other Ambulatory Visit: Payer: Self-pay | Admitting: Internal Medicine

## 2024-07-25 DIAGNOSIS — Z85850 Personal history of malignant neoplasm of thyroid: Secondary | ICD-10-CM

## 2024-07-25 DIAGNOSIS — R0602 Shortness of breath: Secondary | ICD-10-CM

## 2024-07-25 DIAGNOSIS — J449 Chronic obstructive pulmonary disease, unspecified: Secondary | ICD-10-CM
